# Patient Record
Sex: Female | Born: 1985 | Race: Black or African American | Hispanic: No | Marital: Single | Smoking: Never smoker
Health system: Southern US, Community
[De-identification: ages and names within clinical notes are randomized; demographics above are authoritative.]

## PROBLEM LIST (undated history)

## (undated) DIAGNOSIS — A630 Anogenital (venereal) warts: Secondary | ICD-10-CM

## (undated) DIAGNOSIS — IMO0002 Reserved for concepts with insufficient information to code with codable children: Secondary | ICD-10-CM

## (undated) DIAGNOSIS — R87619 Unspecified abnormal cytological findings in specimens from cervix uteri: Secondary | ICD-10-CM

## (undated) HISTORY — DX: Reserved for concepts with insufficient information to code with codable children: IMO0002

## (undated) HISTORY — PX: WRIST SURGERY: SHX841

## (undated) HISTORY — DX: Anogenital (venereal) warts: A63.0

## (undated) HISTORY — PX: CHOLECYSTECTOMY: SHX55

## (undated) HISTORY — DX: Unspecified abnormal cytological findings in specimens from cervix uteri: R87.619

---
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
## 2005-09-20 ENCOUNTER — Emergency Department (HOSPITAL_COMMUNITY): Admission: EM | Admit: 2005-09-20 | Discharge: 2005-09-20 | Payer: Self-pay | Admitting: Emergency Medicine

## 2006-08-15 ENCOUNTER — Emergency Department (HOSPITAL_COMMUNITY): Admission: EM | Admit: 2006-08-15 | Discharge: 2006-08-15 | Payer: Self-pay | Admitting: Emergency Medicine

## 2006-12-11 ENCOUNTER — Emergency Department (HOSPITAL_COMMUNITY): Admission: EM | Admit: 2006-12-11 | Discharge: 2006-12-11 | Payer: Self-pay | Admitting: Emergency Medicine

## 2007-11-17 ENCOUNTER — Emergency Department (HOSPITAL_COMMUNITY): Admission: EM | Admit: 2007-11-17 | Discharge: 2007-11-17 | Payer: Self-pay | Admitting: Emergency Medicine

## 2008-10-11 ENCOUNTER — Emergency Department (HOSPITAL_COMMUNITY): Admission: EM | Admit: 2008-10-11 | Discharge: 2008-10-11 | Payer: Self-pay | Admitting: Emergency Medicine

## 2008-10-17 ENCOUNTER — Emergency Department (HOSPITAL_COMMUNITY): Admission: EM | Admit: 2008-10-17 | Discharge: 2008-10-17 | Payer: Self-pay | Admitting: Emergency Medicine

## 2008-10-28 ENCOUNTER — Emergency Department (HOSPITAL_COMMUNITY): Admission: EM | Admit: 2008-10-28 | Discharge: 2008-10-28 | Payer: Self-pay | Admitting: Emergency Medicine

## 2010-06-03 ENCOUNTER — Ambulatory Visit: Payer: Self-pay | Admitting: Diagnostic Radiology

## 2010-06-03 ENCOUNTER — Emergency Department (HOSPITAL_BASED_OUTPATIENT_CLINIC_OR_DEPARTMENT_OTHER): Admission: EM | Admit: 2010-06-03 | Discharge: 2010-06-03 | Payer: Self-pay | Admitting: Emergency Medicine

## 2010-09-02 ENCOUNTER — Emergency Department (HOSPITAL_COMMUNITY): Admission: EM | Admit: 2010-09-02 | Discharge: 2010-09-02 | Payer: Self-pay | Admitting: Family Medicine

## 2011-09-15 LAB — URINALYSIS, ROUTINE W REFLEX MICROSCOPIC
Bilirubin Urine: NEGATIVE
Glucose, UA: NEGATIVE
Ketones, ur: NEGATIVE
Nitrite: NEGATIVE
Protein, ur: NEGATIVE
Specific Gravity, Urine: 1.017
Urobilinogen, UA: 1
pH: 6

## 2011-09-15 LAB — URINE MICROSCOPIC-ADD ON

## 2011-09-15 LAB — POCT PREGNANCY, URINE: Preg Test, Ur: NEGATIVE

## 2012-05-30 ENCOUNTER — Encounter (HOSPITAL_BASED_OUTPATIENT_CLINIC_OR_DEPARTMENT_OTHER): Payer: Self-pay | Admitting: *Deleted

## 2012-05-30 ENCOUNTER — Emergency Department (HOSPITAL_BASED_OUTPATIENT_CLINIC_OR_DEPARTMENT_OTHER)
Admission: EM | Admit: 2012-05-30 | Discharge: 2012-05-30 | Disposition: A | Discharge status: Home / Self Care | Payer: BC Managed Care – PPO | Attending: Emergency Medicine | Admitting: Emergency Medicine

## 2012-05-30 DIAGNOSIS — Z331 Pregnant state, incidental: Secondary | ICD-10-CM | POA: Insufficient documentation

## 2012-05-30 DIAGNOSIS — R51 Headache: Secondary | ICD-10-CM | POA: Insufficient documentation

## 2012-05-30 DIAGNOSIS — Z3490 Encounter for supervision of normal pregnancy, unspecified, unspecified trimester: Secondary | ICD-10-CM

## 2012-05-30 DIAGNOSIS — Z9089 Acquired absence of other organs: Secondary | ICD-10-CM | POA: Insufficient documentation

## 2012-05-30 LAB — PREGNANCY, URINE: Preg Test, Ur: POSITIVE — AB

## 2012-05-30 MED ORDER — METOCLOPRAMIDE HCL 5 MG/ML IJ SOLN
10.0000 mg | Freq: Once | INTRAMUSCULAR | Status: AC
  Administered 2012-05-30: 10 mg via INTRAVENOUS
  Filled 2012-05-30 (×2): qty 2

## 2012-05-30 MED ORDER — DIPHENHYDRAMINE HCL 50 MG/ML IJ SOLN
25.0000 mg | Freq: Once | INTRAMUSCULAR | Status: AC
  Administered 2012-05-30: 25 mg via INTRAVENOUS
  Filled 2012-05-30 (×2): qty 1

## 2012-05-30 NOTE — ED Notes (Signed)
Pt relaxing at present time on bed with family present, pt laughing and no acute distress noted. Pt currently playing on cell phone. And reports taking pregnancy test on Saturday and (+). No distress to loud noises, light, or other auras noted. Reports hx of migraines and reports this is not like any prior headache. Gait steady, ambulates well, no ataxia. Pt taking po items well prior to arrival. No active N V D CP LOC SOB. PERRLA.

## 2012-05-30 NOTE — Discharge Instructions (Signed)
ABCs of Pregnancy A Antepartum care is very important. Be sure you see your doctor and get prenatal care as soon as you think you are pregnant. At this time, you will be tested for infection, genetic abnormalities and potential problems with you and the pregnancy. This is the time to discuss diet, exercise, work, medications, labor, pain medication during labor and the possibility of a cesarean delivery. Ask any questions that may concern you. It is important to see your doctor regularly throughout your pregnancy. Avoid exposure to toxic substances and chemicals - such as cleaning solvents, lead and mercury, some insecticides, and paint. Pregnant women should avoid exposure to paint fumes, and fumes that cause you to feel ill, dizzy or faint. When possible, it is a good idea to have a pre-pregnancy consultation with your caregiver to begin some important recommendations your caregiver suggests such as, taking folic acid, exercising, quitting smoking, avoiding alcoholic beverages, etc. B Breastfeeding is the healthiest choice for both you and your baby. It has many nutritional benefits for the baby and health benefits for the mother. It also creates a very tight and loving bond between the baby and mother. Talk to your doctor, your family and friends, and your employer about how you choose to feed your baby and how they can support you in your decision. Not all birth defects can be prevented, but a woman can take actions that may increase her chance of having a healthy baby. Many birth defects happen very early in pregnancy, sometimes before a woman even knows she is pregnant. Birth defects or abnormalities of any child in your or the father's family should be discussed with your caregiver. Get a good support bra as your breast size changes. Wear it especially when you exercise and when nursing.  C Celebrate the news of your pregnancy with the your spouse/father and family. Childbirth classes are helpful to  take for you and the spouse/father because it helps to understand what happens during the pregnancy, labor and delivery. Cesarean delivery should be discussed with your doctor so you are prepared for that possibility. The pros and cons of circumcision if it is a boy, should be discussed with your pediatrician. Cigarette smoking during pregnancy can result in low birth weight babies. It has been associated with infertility, miscarriages, tubal pregnancies, infant death (mortality) and poor health (morbidity) in childhood. Additionally, cigarette smoking may cause long-term learning disabilities. If you smoke, you should try to quit before getting pregnant and not smoke during the pregnancy. Secondary smoke may also harm a mother and her developing baby. It is a good idea to ask people to stop smoking around you during your pregnancy and after the baby is born. Extra calcium is necessary when you are pregnant and is found in your prenatal vitamin, in dairy products, green leafy vegetables and in calcium supplements. D A healthy diet according to your current weight and height, along with vitamins and mineral supplements should be discussed with your caregiver. Domestic abuse or violence should be made known to your doctor right away to get the situation corrected. Drink more water when you exercise to keep hydrated. Discomfort of your back and legs usually develops and progresses from the middle of the second trimester through to delivery of the baby. This is because of the enlarging baby and uterus, which may also affect your balance. Do not take illegal drugs. Illegal drugs can seriously harm the baby and you. Drink extra fluids (water is best) throughout pregnancy to help  your body keep up with the increases in your blood volume. Drink at least 6 to 8 glasses of water, fruit juice, or milk each day. A good way to know you are drinking enough fluid is when your urine looks almost like clear water or is very light  yellow.  E Eat healthy to get the nutrients you and your unborn baby need. Your meals should include the five basic food groups. Exercise (30 minutes of light to moderate exercise a day) is important and encouraged during pregnancy, if there are no medical problems or problems with the pregnancy. Exercise that causes discomfort or dizziness should be stopped and reported to your caregiver. Emotions during pregnancy can change from being ecstatic to depression and should be understood by you, your partner and your family. F Fetal screening with ultrasound, amniocentesis and monitoring during pregnancy and labor is common and sometimes necessary. Take 400 micrograms of folic acid daily both before, when possible, and during the first few months of pregnancy to reduce the risk of birth defects of the brain and spine. All women who could possibly become pregnant should take a vitamin with folic acid, every day. It is also important to eat a healthy diet with fortified foods (enriched grain products, including cereals, rice, breads, and pastas) and foods with natural sources of folate (orange juice, green leafy vegetables, beans, peanuts, broccoli, asparagus, peas, and lentils). The father should be involved with all aspects of the pregnancy including, the prenatal care, childbirth classes, labor, delivery, and postpartum time. Fathers may also have emotional concerns about being a father, financial needs, and raising a family. G Genetic testing should be done appropriately. It is important to know your family and the father's history. If there have been problems with pregnancies or birth defects in your family, report these to your doctor. Also, genetic counselors can talk with you about the information you might need in making decisions about having a family. You can call a major medical center in your area for help in finding a board-certified genetic counselor. Genetic testing and counseling should be done  before pregnancy when possible, especially if there is a history of problems in the mother's or father's family. Certain ethnic backgrounds are more at risk for genetic defects. H Get familiar with the hospital where you will be having your baby. Get to know how long it takes to get there, the labor and delivery area, and the hospital procedures. Be sure your medical insurance is accepted there. Get your home ready for the baby including, clothes, the baby's room (when possible), furniture and car seat. Hand washing is important throughout the day, especially after handling raw meat and poultry, changing the baby's diaper or using the bathroom. This can help prevent the spread of many bacteria and viruses that cause infection. Your hair may become dry and thinner, but will return to normal a few weeks after the baby is born. Heartburn is a common problem that can be treated by taking antacids recommended by your caregiver, eating smaller meals 5 or 6 times a day, not drinking liquids when eating, drinking between meals and raising the head of your bed 2 to 3 inches. I Insurance to cover you, the baby, doctor and hospital should be reviewed so that you will be prepared to pay any costs not covered by your insurance plan. If you do not have medical insurance, there are usually clinics and services available for you in your community. Take 30 milligrams of iron during  your pregnancy as prescribed by your doctor to reduce the risk of low red blood cells (anemia) later in pregnancy. All women of childbearing age should eat a diet rich in iron. J There should be a joint effort for the mother, father and any other children to adapt to the pregnancy financially, emotionally, and psychologically during the pregnancy. Join a support group for moms-to-be. Or, join a class on parenting or childbirth. Have the family participate when possible. K Know your limits. Let your caregiver know if you experience any of the  following:   Pain of any kind.   Strong cramps.   You develop a lot of weight in a short period of time (5 pounds in 3 to 5 days).   Vaginal bleeding, leaking of amniotic fluid.   Headache, vision problems.   Dizziness, fainting, shortness of breath.   Chest pain.   Fever of 102 F (38.9 C) or higher.   Gush of clear fluid from your vagina.   Painful urination.   Domestic violence.   Irregular heartbeat (palpitations).   Rapid beating of the heart (tachycardia).   Constant feeling sick to your stomach (nauseous) and vomiting.   Trouble walking, fluid retention (edema).   Muscle weakness.   If your baby has decreased activity.   Persistent diarrhea.   Abnormal vaginal discharge.   Uterine contractions at 20-minute intervals.   Back pain that travels down your leg.  L Learn and practice that what you eat and drink should be in moderation and healthy for you and your baby. Legal drugs such as alcohol and caffeine are important issues for pregnant women. There is no safe amount of alcohol a woman can drink while pregnant. Fetal alcohol syndrome, a disorder characterized by growth retardation, facial abnormalities, and central nervous system dysfunction, is caused by a woman's use of alcohol during pregnancy. Caffeine, found in tea, coffee, soft drinks and chocolate, should also be limited. Be sure to read labels when trying to cut down on caffeine during pregnancy. More than 200 foods, beverages, and over-the-counter medications contain caffeine and have a high salt content! There are coffees and teas that do not contain caffeine. M Medical conditions such as diabetes, epilepsy, and high blood pressure should be treated and kept under control before pregnancy when possible, but especially during pregnancy. Ask your caregiver about any medications that may need to be changed or adjusted during pregnancy. If you are currently taking any medications, ask your caregiver if it  is safe to take them while you are pregnant or before getting pregnant when possible. Also, be sure to discuss any herbs or vitamins you are taking. They are medicines, too! Discuss with your doctor all medications, prescribed and over-the-counter, that you are taking. During your prenatal visit, discuss the medications your doctor may give you during labor and delivery. N Never be afraid to ask your doctor or caregiver questions about your health, the progress of the pregnancy, family problems, stressful situations, and recommendation for a pediatrician, if you do not have one. It is better to take all precautions and discuss any questions or concerns you may have during your office visits. It is a good idea to write down your questions before you visit the doctor. O Over-the-counter cough and cold remedies may contain alcohol or other ingredients that should be avoided during pregnancy. Ask your caregiver about prescription, herbs or over-the-counter medications that you are taking or may consider taking while pregnant.  P Physical activity during pregnancy can  benefit both you and your baby by lessening discomfort and fatigue, providing a sense of well-being, and increasing the likelihood of early recovery after delivery. Light to moderate exercise during pregnancy strengthens the belly (abdominal) and back muscles. This helps improve posture. Practicing yoga, walking, swimming, and cycling on a stationary bicycle are usually safe exercises for pregnant women. Avoid scuba diving, exercise at high altitudes (over 3000 feet), skiing, horseback riding, contact sports, etc. Always check with your doctor before beginning any kind of exercise, especially during pregnancy and especially if you did not exercise before getting pregnant. Q Queasiness, stomach upset and morning sickness are common during pregnancy. Eating a couple of crackers or dry toast before getting out of bed. Foods that you normally love may  make you feel sick to your stomach. You may need to substitute other nutritious foods. Eating 5 or 6 small meals a day instead of 3 large ones may make you feel better. Do not drink with your meals, drink between meals. Questions that you have should be written down and asked during your prenatal visits. R Read about and make plans to baby-proof your home. There are important tips for making your home a safer environment for your baby. Review the tips and make your home safer for you and your baby. Read food labels regarding calories, salt and fat content in the food. S Saunas, hot tubs, and steam rooms should be avoided while you are pregnant. Excessive high heat may be harmful during your pregnancy. Your caregiver will screen and examine you for sexually transmitted diseases and genetic disorders during your prenatal visits. Learn the signs of labor. Sexual relations while pregnant is safe unless there is a medical or pregnancy problem and your caregiver advises against it. T Traveling long distances should be avoided especially in the third trimester of your pregnancy. If you do have to travel out of state, be sure to take a copy of your medical records and medical insurance plan with you. You should not travel long distances without seeing your doctor first. Most airlines will not allow you to travel after 36 weeks of pregnancy. Toxoplasmosis is an infection caused by a parasite that can seriously harm an unborn baby. Avoid eating undercooked meat and handling cat litter. Be sure to wear gloves when gardening. Tingling of the hands and fingers is not unusual and is due to fluid retention. This will go away after the baby is born. U Womb (uterus) size increases during the first trimester. Your kidneys will begin to function more efficiently. This may cause you to feel the need to urinate more often. You may also leak urine when sneezing, coughing or laughing. This is due to the growing uterus pressing  against your bladder, which lies directly in front of and slightly under the uterus during the first few months of pregnancy. If you experience burning along with frequency of urination or bloody urine, be sure to tell your doctor. The size of your uterus in the third trimester may cause a problem with your balance. It is advisable to maintain good posture and avoid wearing high heels during this time. An ultrasound of your baby may be necessary during your pregnancy and is safe for you and your baby. V Vaccinations are an important concern for pregnant women. Get needed vaccines before pregnancy. Center for Disease Control (http://www.wolf.info/) has clear guidelines for the use of vaccines during pregnancy. Review the list, be sure to discuss it with your doctor. Prenatal vitamins are helpful  and healthy for you and the baby. Do not take extra vitamins except what is recommended. Taking too much of certain vitamins can cause overdose problems. Continuous vomiting should be reported to your caregiver. Varicose veins may appear especially if there is a family history of varicose veins. They should subside after the delivery of the baby. Support hose helps if there is leg discomfort. W Being overweight or underweight during pregnancy may cause problems. Try to get within 15 pounds of your ideal weight before pregnancy. Remember, pregnancy is not a time to be dieting! Do not stop eating or start skipping meals as your weight increases. Both you and your baby need the calories and nutrition you receive from a healthy diet. Be sure to consult with your doctor about your diet. There is a formula and diet plan available depending on whether you are overweight or underweight. Your caregiver or nutritionist can help and advise you if necessary. X Avoid X-rays. If you must have dental work or diagnostic tests, tell your dentist or physician that you are pregnant so that extra care can be taken. X-rays should only be taken when  the risks of not taking them outweigh the risk of taking them. If needed, only the minimum amount of radiation should be used. When X-rays are necessary, protective lead shields should be used to cover areas of the body that are not being X-rayed. Y Your baby loves you. Breastfeeding your baby creates a loving and very close bond between the two of you. Give your baby a healthy environment to live in while you are pregnant. Infants and children require constant care and guidance. Their health and safety should be carefully watched at all times. After the baby is born, rest or take a nap when the baby is sleeping. Z Get your ZZZs. Be sure to get plenty of rest. Resting on your side as often as possible, especially on your left side is advised. It provides the best circulation to your baby and helps reduce swelling. Try taking a nap for 30 to 45 minutes in the afternoon when possible. After the baby is born rest or take a nap when the baby is sleeping. Try elevating your feet for that amount of time when possible. It helps the circulation in your legs and helps reduce swelling.  Most information courtesy of the CDC. Document Released: 11/30/2005 Document Revised: 11/19/2011 Document Reviewed: 08/14/2009 [facility] Patient Information 2012 [place], [place].Headaches, Frequently Asked Questions MIGRAINE HEADACHES Q: What is migraine? What causes it? How can I treat it? A: Generally, migraine headaches begin as a dull ache. Then they develop into a constant, throbbing, and pulsating pain. You may experience pain at the temples. You may experience pain at the front or back of one or both sides of the head. The pain is usually accompanied by a combination of:  Nausea.   Vomiting.   Sensitivity to light and noise.  Some people (about 15%) experience an aura (see below) before an attack. The cause of migraine is believed to be chemical reactions in the brain. Treatment for migraine may include over-the-counter  or prescription medications. It may also include self-help techniques. These include relaxation training and biofeedback.  Q: What is an aura? A: About 15% of people with migraine get an "aura". This is a sign of neurological symptoms that occur before a migraine headache. You may see wavy or jagged lines, dots, or flashing lights. You might experience tunnel vision or blind spots in one or both eyes.  The aura can include visual or auditory hallucinations (something imagined). It may include disruptions in smell (such as strange odors), taste or touch. Other symptoms include:  Numbness.   A "pins and needles" sensation.   Difficulty in recalling or speaking the correct word.  These neurological events may last as long as 60 minutes. These symptoms will fade as the headache begins. Q: What is a trigger? A: Certain physical or environmental factors can lead to or "trigger" a migraine. These include:  Foods.   Hormonal changes.   Weather.   Stress.  It is important to remember that triggers are different for everyone. To help prevent migraine attacks, you need to figure out which triggers affect you. Keep a headache diary. This is a good way to track triggers. The diary will help you talk to your healthcare professional about your condition. Q: Does weather affect migraines? A: Bright sunshine, hot, humid conditions, and drastic changes in barometric pressure may lead to, or "trigger," a migraine attack in some people. But studies have shown that weather does not act as a trigger for everyone with migraines. Q: What is the link between migraine and hormones? A: Hormones start and regulate many of your body's functions. Hormones keep your body in balance within a constantly changing environment. The levels of hormones in your body are unbalanced at times. Examples are during menstruation, pregnancy, or menopause. That can lead to a migraine attack. In fact, about three quarters of all women with  migraine report that their attacks are related to the menstrual cycle.  Q: Is there an increased risk of stroke for migraine sufferers? A: The likelihood of a migraine attack causing a stroke is very remote. That is not to say that migraine sufferers cannot have a stroke associated with their migraines. In persons under age 35, the most common associated factor for stroke is migraine headache. But over the course of a person's normal life span, the occurrence of migraine headache may actually be associated with a reduced risk of dying from cerebrovascular disease due to stroke.  Q: What are acute medications for migraine? A: Acute medications are used to treat the pain of the headache after it has started. Examples over-the-counter medications, NSAIDs, ergots, and triptans.  Q: What are the triptans? A: Triptans are the newest class of abortive medications. They are specifically targeted to treat migraine. Triptans are vasoconstrictors. They moderate some chemical reactions in the brain. The triptans work on receptors in your brain. Triptans help to restore the balance of a neurotransmitter called serotonin. Fluctuations in levels of serotonin are thought to be a main cause of migraine.  Q: Are over-the-counter medications for migraine effective? A: Over-the-counter, or "OTC," medications may be effective in relieving mild to moderate pain and associated symptoms of migraine. But you should see your caregiver before beginning any treatment regimen for migraine.  Q: What are preventive medications for migraine? A: Preventive medications for migraine are sometimes referred to as "prophylactic" treatments. They are used to reduce the frequency, severity, and length of migraine attacks. Examples of preventive medications include antiepileptic medications, antidepressants, beta-blockers, calcium channel blockers, and NSAIDs (nonsteroidal anti-inflammatory drugs). Q: Why are anticonvulsants used to treat  migraine? A: During the past few years, there has been an increased interest in antiepileptic drugs for the prevention of migraine. They are sometimes referred to as "anticonvulsants". Both epilepsy and migraine may be caused by similar reactions in the brain.  Q: Why are antidepressants used to treat migraine? A:  Antidepressants are typically used to treat people with depression. They may reduce migraine frequency by regulating chemical levels, such as serotonin, in the brain.  Q: What alternative therapies are used to treat migraine? A: The term "alternative therapies" is often used to describe treatments considered outside the scope of conventional Western medicine. Examples of alternative therapy include acupuncture, acupressure, and yoga. Another common alternative treatment is herbal therapy. Some herbs are believed to relieve headache pain. Always discuss alternative therapies with your caregiver before proceeding. Some herbal products contain arsenic and other toxins. TENSION HEADACHES Q: What is a tension-type headache? What causes it? How can I treat it? A: Tension-type headaches occur randomly. They are often the result of temporary stress, anxiety, fatigue, or anger. Symptoms include soreness in your temples, a tightening band-like sensation around your head (a "vice-like" ache). Symptoms can also include a pulling feeling, pressure sensations, and contracting head and neck muscles. The headache begins in your forehead, temples, or the back of your head and neck. Treatment for tension-type headache may include over-the-counter or prescription medications. Treatment may also include self-help techniques such as relaxation training and biofeedback. CLUSTER HEADACHES Q: What is a cluster headache? What causes it? How can I treat it? A: Cluster headache gets its name because the attacks come in groups. The pain arrives with little, if any, warning. It is usually on one side of the head. A tearing  or bloodshot eye and a runny nose on the same side of the headache may also accompany the pain. Cluster headaches are believed to be caused by chemical reactions in the brain. They have been described as the most severe and intense of any headache type. Treatment for cluster headache includes prescription medication and oxygen. SINUS HEADACHES Q: What is a sinus headache? What causes it? How can I treat it? A: When a cavity in the bones of the face and skull (a sinus) becomes inflamed, the inflammation will cause localized pain. This condition is usually the result of an allergic reaction, a tumor, or an infection. If your headache is caused by a sinus blockage, such as an infection, you will probably have a fever. An x-ray will confirm a sinus blockage. Your caregiver's treatment might include antibiotics for the infection, as well as antihistamines or decongestants.  REBOUND HEADACHES Q: What is a rebound headache? What causes it? How can I treat it? A: A pattern of taking acute headache medications too often can lead to a condition known as "rebound headache." A pattern of taking too much headache medication includes taking it more than 2 days per week or in excessive amounts. That means more than the label or a caregiver advises. With rebound headaches, your medications not only stop relieving pain, they actually begin to cause headaches. Doctors treat rebound headache by tapering the medication that is being overused. Sometimes your caregiver will gradually substitute a different type of treatment or medication. Stopping may be a challenge. Regularly overusing a medication increases the potential for serious side effects. Consult a caregiver if you regularly use headache medications more than 2 days per week or more than the label advises. ADDITIONAL QUESTIONS AND ANSWERS Q: What is biofeedback? A: Biofeedback is a self-help treatment. Biofeedback uses special equipment to monitor your body's  involuntary physical responses. Biofeedback monitors:  Breathing.   Pulse.   Heart rate.   Temperature.   Muscle tension.   Brain activity.  Biofeedback helps you refine and perfect your relaxation exercises. You learn to control the  physical responses that are related to stress. Once the technique has been mastered, you do not need the equipment any more. Q: Are headaches hereditary? A: Four out of five (80%) of people that suffer report a family history of migraine. Scientists are not sure if this is genetic or a family predisposition. Despite the uncertainty, a child has a 50% chance of having migraine if one parent suffers. The child has a 75% chance if both parents suffer.  Q: Can children get headaches? A: By the time they reach high school, most young people have experienced some type of headache. Many safe and effective approaches or medications can prevent a headache from occurring or stop it after it has begun.  Q: What type of doctor should I see to diagnose and treat my headache? A: Start with your primary caregiver. Discuss his or her experience and approach to headaches. Discuss methods of classification, diagnosis, and treatment. Your caregiver may decide to recommend you to a headache specialist, depending upon your symptoms or other physical conditions. Having diabetes, allergies, etc., may require a more comprehensive and inclusive approach to your headache. The National Head[employer] Foundation will provide, upon request, a list of [facility] physician members in your state. Document Released: 02/20/2004 Document Revised: 11/19/2011 Document Reviewed: 07/30/2008 [facility] Patient Information 2012 [place], [place].

## 2012-05-30 NOTE — ED Provider Notes (Signed)
History     CSN: [id number]  Arrival date & time 05/30/12  5621   First MD Initiated Contact with Patient 05/30/12 1836      Chief Complaint  Patient presents with  . Headache  . Blurred Vision    (Consider location/radiation/quality/duration/timing/severity/associated sxs/prior treatment) Patient is a 26 y.o. female presenting with headaches. The history is provided by the patient. No language interpreter was used.  Headache  This is a new problem. The current episode started yesterday. The problem occurs constantly. The problem has not changed since onset.The pain is located in the right unilateral region. The quality of the pain is described as sharp and throbbing. The pain is moderate. The pain does not radiate. She has tried nothing for the symptoms.  Pt reports she has a history of migranes.  She has not taken any medications.   History reviewed. No pertinent past medical history.  Past Surgical History  Procedure Date  . Cholecystectomy     History reviewed. No pertinent family history.  History  Substance Use Topics  . Smoking status: Never Smoker   . Smokeless tobacco: Not on file  . Alcohol Use: No    OB History    Grav Para Term Preterm Abortions TAB SAB Ect Mult Living                  Review of Systems  Neurological: Positive for headaches.  All other systems reviewed and are negative.    Allergies  Tramadol  Home Medications   Current Outpatient Rx  Name Route Sig Dispense Refill  . NAPROXEN SODIUM 220 MG PO TABS Oral Take 440 mg by mouth 2 (two) times daily with a meal. Patient used this medication for her headache.      BP 120/57  Pulse 81  Temp 98.7 F (37.1 C) (Oral)  Resp 16  Ht 5\' 3"  (1.6 m)  Wt 160 lb (72.576 kg)  BMI 28.34 kg/m2  SpO2 100%  LMP 04/16/2012  Physical Exam  Nursing note and vitals reviewed. Constitutional: She is oriented to person, place, and time. She appears well-developed and well-nourished.  HENT:    Head: Normocephalic and atraumatic.  Right Ear: External ear normal.  Left Ear: External ear normal.  Nose: Nose normal.  Mouth/Throat: Oropharynx is clear and moist.  Eyes: Conjunctivae and EOM are normal. Pupils are equal, round, and reactive to light.  Neck: Normal range of motion. Neck supple.  Cardiovascular: Normal rate and normal heart sounds.   Pulmonary/Chest: Effort normal and breath sounds normal.  Abdominal: Soft.  Musculoskeletal: Normal range of motion.  Neurological: She is alert and oriented to person, place, and time. She has normal reflexes.  Skin: Skin is warm.  Psychiatric: She has a normal mood and affect.    ED Course  Procedures (including critical care time)  Labs Reviewed - No data to display No results found.   1. Headache   2. Pregnancy       MDM   I doubt pathologic headache, sounds like migrane.  Pt request a pregnancy test here.  Pt had a positive 3 days ago.         Lonia Skinner [place], [place] 05/30/12 2049

## 2012-05-30 NOTE — ED Notes (Signed)
Pt states positive preg at home  X 3 days ago, c/o h/a and blurred vision x 1 day

## 2012-05-30 NOTE — ED Provider Notes (Signed)
Medical screening examination/treatment/procedure(s) were performed by non-physician practitioner and as supervising physician I was immediately available for consultation/collaboration.   Akiyah Eppolito, MD 05/30/12 2356 

## 2012-06-27 LAB — OB RESULTS CONSOLE RPR: RPR: NONREACTIVE

## 2012-06-27 LAB — OB RESULTS CONSOLE GC/CHLAMYDIA: Chlamydia: NEGATIVE

## 2012-11-16 ENCOUNTER — Encounter: Payer: Medicaid Other | Attending: Obstetrics and Gynecology | Admitting: *Deleted

## 2012-11-16 VITALS — Ht 63.0 in | Wt 189.7 lb

## 2012-11-16 DIAGNOSIS — O99810 Abnormal glucose complicating pregnancy: Secondary | ICD-10-CM

## 2012-11-16 DIAGNOSIS — Z713 Dietary counseling and surveillance: Secondary | ICD-10-CM | POA: Insufficient documentation

## 2012-11-17 ENCOUNTER — Encounter: Payer: Self-pay | Admitting: *Deleted

## 2012-11-17 NOTE — Progress Notes (Signed)
  Patient was seen on 11/16/12 for Gestational Diabetes self-management class at the [facility]. The following learning objectives were met by the patient during this course:   States the definition of Gestational Diabetes  States why dietary management is important in controlling blood glucose  Describes the effects each nutrient has on blood glucose levels  Demonstrates ability to create a balanced meal plan  Demonstrates carbohydrate counting   States when to check blood glucose levels  Demonstrates proper blood glucose monitoring techniques  States the effect of stress and exercise on blood glucose levels  States the importance of limiting caffeine and abstaining from alcohol and smoking  Blood glucose monitor given: Accu Chek Aviva Plus BG Monitoring Kit Lot # [id number]  Exp: 01/13/2014 Blood glucose reading: 99 mg/dl  Patient instructed to monitor glucose levels: FBS: 60 - <90 1 hour: <140  *Patient received handouts:  Nutrition Diabetes and Pregnancy  Carbohydrate Counting List  Patient will be seen for follow-up as needed.

## 2012-11-17 NOTE — Patient Instructions (Signed)
Goals:  Check glucose levels per MD as instructed  Follow Gestational Diabetes Diet as instructed  Call for follow-up as needed    

## 2012-12-14 NOTE — L&D Delivery Note (Signed)
Delivery Note At 3:05 PM a viable female was delivered via Vaginal, Spontaneous Delivery (Presentation:ROA).  APGAR:8 , 9; weight pending.   Placenta status: Intact, Spontaneous.  Cord: 3 vessels with the following complications: corporal cord x 1    Anesthesia: Epidural  Episiotomy: none Lacerations: right labial abrasion Suture Repair: 3.0 vicryl rapide Est. Blood Loss (mL):   Mom to postpartum.  Baby to nursery-stable.  Oliver Pila 01/24/2013, 3:21 PM

## 2012-12-29 LAB — OB RESULTS CONSOLE GBS: GBS: NEGATIVE

## 2013-01-12 ENCOUNTER — Telehealth (HOSPITAL_COMMUNITY): Payer: Self-pay | Admitting: *Deleted

## 2013-01-12 ENCOUNTER — Encounter (HOSPITAL_COMMUNITY): Payer: Self-pay | Admitting: *Deleted

## 2013-01-12 NOTE — Telephone Encounter (Signed)
Preadmission screen  

## 2013-01-23 ENCOUNTER — Inpatient Hospital Stay (HOSPITAL_COMMUNITY)
Admission: RE | Admit: 2013-01-23 | Discharge: 2013-01-26 | DRG: 775 | Disposition: A | Discharge status: Home / Self Care | Payer: Medicaid Other | Source: Ambulatory Visit | Attending: Obstetrics and Gynecology | Admitting: Obstetrics and Gynecology

## 2013-01-23 DIAGNOSIS — Z22330 Carrier of Group B streptococcus: Secondary | ICD-10-CM

## 2013-01-23 DIAGNOSIS — O99892 Other specified diseases and conditions complicating childbirth: Secondary | ICD-10-CM | POA: Diagnosis present

## 2013-01-23 DIAGNOSIS — O99814 Abnormal glucose complicating childbirth: Principal | ICD-10-CM | POA: Diagnosis present

## 2013-01-23 LAB — CBC
MCH: 28.7 pg (ref 26.0–34.0)
MCHC: 34.1 g/dL (ref 30.0–36.0)
MCV: 84.2 fL (ref 78.0–100.0)
Platelets: 283 10*3/uL (ref 150–400)
RBC: 4.18 MIL/uL (ref 3.87–5.11)
RDW: 13.7 % (ref 11.5–15.5)

## 2013-01-23 MED ORDER — OXYCODONE-ACETAMINOPHEN 5-325 MG PO TABS: 1.0000 | ORAL_TABLET | ORAL | Status: DC | PRN

## 2013-01-23 MED ORDER — PENICILLIN G POTASSIUM 5000000 UNITS IJ SOLR
5.0000 10*6.[IU] | Freq: Once | INTRAVENOUS | Status: AC
  Administered 2013-01-23: 5 10*6.[IU] via INTRAVENOUS
  Filled 2013-01-23: qty 5

## 2013-01-23 MED ORDER — BUTORPHANOL TARTRATE 1 MG/ML IJ SOLN
1.0000 mg | INTRAMUSCULAR | Status: DC | PRN
  Administered 2013-01-24 (×2): 1 mg via INTRAVENOUS
  Filled 2013-01-23 (×2): qty 1

## 2013-01-23 MED ORDER — ACETAMINOPHEN 325 MG PO TABS: 650.0000 mg | ORAL_TABLET | ORAL | Status: DC | PRN

## 2013-01-23 MED ORDER — OXYTOCIN 40 UNITS IN LACTATED RINGERS INFUSION - SIMPLE MED
62.5000 mL/h | INTRAVENOUS | Status: DC
  Administered 2013-01-24: 62.5 mL/h via INTRAVENOUS

## 2013-01-23 MED ORDER — IBUPROFEN 600 MG PO TABS: 600.0000 mg | ORAL_TABLET | Freq: Four times a day (QID) | ORAL | Status: DC | PRN

## 2013-01-23 MED ORDER — OXYTOCIN BOLUS FROM INFUSION: 500.0000 mL | INTRAVENOUS | Status: DC

## 2013-01-23 MED ORDER — CITRIC ACID-SODIUM CITRATE 334-500 MG/5ML PO SOLN: 30.0000 mL | ORAL | Status: DC | PRN

## 2013-01-23 MED ORDER — ONDANSETRON HCL 4 MG/2ML IJ SOLN
4.0000 mg | Freq: Four times a day (QID) | INTRAMUSCULAR | Status: DC | PRN
  Administered 2013-01-24: 4 mg via INTRAVENOUS
  Filled 2013-01-23: qty 2

## 2013-01-23 MED ORDER — LACTATED RINGERS IV SOLN
INTRAVENOUS | Status: DC
  Administered 2013-01-24 (×2): via INTRAVENOUS

## 2013-01-23 MED ORDER — MISOPROSTOL 25 MCG QUARTER TABLET
25.0000 ug | ORAL_TABLET | ORAL | Status: DC | PRN
  Administered 2013-01-23: 25 ug via VAGINAL
  Filled 2013-01-23: qty 0.25

## 2013-01-23 MED ORDER — PENICILLIN G POTASSIUM 5000000 UNITS IJ SOLR
2.5000 10*6.[IU] | INTRAVENOUS | Status: DC
  Administered 2013-01-24 (×4): 2.5 10*6.[IU] via INTRAVENOUS
  Filled 2013-01-23 (×7): qty 2.5

## 2013-01-23 MED ORDER — ZOLPIDEM TARTRATE 5 MG PO TABS
5.0000 mg | ORAL_TABLET | Freq: Every evening | ORAL | Status: AC | PRN
  Administered 2013-01-24: 5 mg via ORAL
  Filled 2013-01-23: qty 1

## 2013-01-23 MED ORDER — LACTATED RINGERS IV SOLN: 500.0000 mL | INTRAVENOUS | Status: DC | PRN

## 2013-01-23 MED ORDER — TERBUTALINE SULFATE 1 MG/ML IJ SOLN: 0.2500 mg | Freq: Once | INTRAMUSCULAR | Status: AC | PRN

## 2013-01-23 MED ORDER — LIDOCAINE HCL (PF) 1 % IJ SOLN
30.0000 mL | INTRAMUSCULAR | Status: DC | PRN
  Filled 2013-01-23: qty 30

## 2013-01-23 NOTE — H&P (Signed)
Katelyn Larson is a 27 y.o. female at 13 6/7 weeks (EDD 01/24/13 by 9 week [place]) presenting for IOL at term.  Prenatal care is complicated by GDM, well-controlled with diet.  She is also GBS+ by a urine cx early in pregnancy, so will be treated.  Maternal Medical History:  Contractions: Frequency: irregular.   Perceived severity is mild.    Fetal activity: Perceived fetal activity is normal.    Prenatal Complications - Diabetes: gestational. Diabetes is managed by diet.      OB History   Grav Para Term Preterm Abortions TAB SAB Ect Mult Living   2 1 1       1     2006 NSVD 6#9oz  Past Medical History  Diagnosis Date  . Diabetes mellitus without complication   . Abnormal Pap smear   . HPV (human papilloma virus) anogenital infection   . Gestational diabetes     diet controlled   Past Surgical History  Procedure Laterality Date  . Cholecystectomy     Family History: family history includes Cancer in her maternal aunt, paternal aunt, and paternal grandmother; Diabetes in her maternal aunt, maternal uncle, paternal aunt, and paternal uncle; and Heart disease in her maternal aunt, maternal uncle, paternal aunt, and paternal uncle. Social History:  reports that she has never smoked. She has never used smokeless tobacco. She reports that she does not drink alcohol or use illicit drugs.   Prenatal Transfer Tool  Maternal Diabetes: Yes:  Diabetes Type:  Diet controlled Genetic Screening: Normal Maternal Ultrasounds/Referrals: Normal Fetal Ultrasounds or other Referrals:  None Maternal Substance Abuse:  No Significant Maternal Medications:  None Significant Maternal Lab Results:  Lab values include: Group B Strep positive Other Comments:  None  ROS    Last menstrual period 06/27/2012. Maternal Exam:  Uterine Assessment: Contraction strength is mild.  Contraction frequency is irregular.   Abdomen: Patient reports no abdominal tenderness. Fetal presentation:  vertex  Introitus: Normal vulva. Normal vagina.    Physical Exam  Constitutional: She appears well-developed and well-nourished.  Cardiovascular: Normal rate and regular rhythm.   Respiratory: Effort normal and breath sounds normal.  GI: Soft. Bowel sounds are normal.  Genitourinary: Vagina normal and uterus normal.  Neurological: She is alert.  Psychiatric: She has a normal mood and affect. Her behavior is normal.    Prenatal labs: ABO, Rh: AB/Positive/-- (07/15 0000) Antibody: Negative (07/15 0000) Rubella: Immune (07/15 0000) RPR: Nonreactive (07/15 0000)  HBsAg: Negative (07/15 0000)  HIV: Non-reactive (07/15 0000)  GBS: positive on an early UA  One hour GTT 140 Three hour 99/178/184/151 Hgb AA First trimester screen and AFP WNL  Assessment/Plan: Pt for IOL at term with a borderline favorable cervix.  Will admit and ripen with cytotec this pm.  Will treat with PCN for a +GBS in her urine early in pregnancy.   Oliver Pila 01/23/2013, 6:02 PM

## 2013-01-24 ENCOUNTER — Inpatient Hospital Stay (HOSPITAL_COMMUNITY): Admission: RE | Admit: 2013-01-24 | Payer: BC Managed Care – PPO | Source: Ambulatory Visit

## 2013-01-24 ENCOUNTER — Encounter (HOSPITAL_COMMUNITY): Payer: Self-pay | Admitting: Anesthesiology

## 2013-01-24 ENCOUNTER — Inpatient Hospital Stay (HOSPITAL_COMMUNITY): Payer: Medicaid Other | Admitting: Anesthesiology

## 2013-01-24 ENCOUNTER — Encounter (HOSPITAL_COMMUNITY): Payer: Self-pay

## 2013-01-24 LAB — RPR: RPR Ser Ql: NONREACTIVE

## 2013-01-24 LAB — TYPE AND SCREEN

## 2013-01-24 LAB — GLUCOSE, CAPILLARY: Glucose-Capillary: 118 mg/dL — ABNORMAL HIGH (ref 70–99)

## 2013-01-24 MED ORDER — TETANUS-DIPHTH-ACELL PERTUSSIS 5-2.5-18.5 LF-MCG/0.5 IM SUSP: 0.5000 mL | Freq: Once | INTRAMUSCULAR | Status: DC

## 2013-01-24 MED ORDER — IBUPROFEN 600 MG PO TABS
600.0000 mg | ORAL_TABLET | Freq: Four times a day (QID) | ORAL | Status: DC
  Administered 2013-01-24 – 2013-01-26 (×8): 600 mg via ORAL
  Filled 2013-01-24 (×8): qty 1

## 2013-01-24 MED ORDER — PHENYLEPHRINE 40 MCG/ML (10ML) SYRINGE FOR IV PUSH (FOR BLOOD PRESSURE SUPPORT)
80.0000 ug | PREFILLED_SYRINGE | INTRAVENOUS | Status: DC | PRN
  Filled 2013-01-24: qty 5

## 2013-01-24 MED ORDER — LANOLIN HYDROUS EX OINT: TOPICAL_OINTMENT | CUTANEOUS | Status: DC | PRN

## 2013-01-24 MED ORDER — WITCH HAZEL-GLYCERIN EX PADS: 1.0000 "application " | MEDICATED_PAD | CUTANEOUS | Status: DC | PRN

## 2013-01-24 MED ORDER — ZOLPIDEM TARTRATE 5 MG PO TABS: 5.0000 mg | ORAL_TABLET | Freq: Every evening | ORAL | Status: DC | PRN

## 2013-01-24 MED ORDER — DIBUCAINE 1 % RE OINT: 1.0000 "application " | TOPICAL_OINTMENT | RECTAL | Status: DC | PRN

## 2013-01-24 MED ORDER — OXYCODONE-ACETAMINOPHEN 5-325 MG PO TABS
1.0000 | ORAL_TABLET | ORAL | Status: DC | PRN
  Administered 2013-01-25: 1 via ORAL
  Administered 2013-01-25 (×2): 2 via ORAL
  Filled 2013-01-24: qty 2
  Filled 2013-01-24: qty 1
  Filled 2013-01-24: qty 2

## 2013-01-24 MED ORDER — EPHEDRINE 5 MG/ML INJ
10.0000 mg | INTRAVENOUS | Status: DC | PRN
  Filled 2013-01-24 (×2): qty 4

## 2013-01-24 MED ORDER — BENZOCAINE-MENTHOL 20-0.5 % EX AERO
1.0000 "application " | INHALATION_SPRAY | CUTANEOUS | Status: DC | PRN
  Administered 2013-01-25: 1 via TOPICAL
  Filled 2013-01-24: qty 56

## 2013-01-24 MED ORDER — FENTANYL 2.5 MCG/ML BUPIVACAINE 1/10 % EPIDURAL INFUSION (WH - ANES)
14.0000 mL/h | INTRAMUSCULAR | Status: DC
  Administered 2013-01-24: 14 mL/h via EPIDURAL
  Filled 2013-01-24: qty 125

## 2013-01-24 MED ORDER — ONDANSETRON HCL 4 MG PO TABS: 4.0000 mg | ORAL_TABLET | ORAL | Status: DC | PRN

## 2013-01-24 MED ORDER — ONDANSETRON HCL 4 MG/2ML IJ SOLN: 4.0000 mg | INTRAMUSCULAR | Status: DC | PRN

## 2013-01-24 MED ORDER — OXYTOCIN 40 UNITS IN LACTATED RINGERS INFUSION - SIMPLE MED
1.0000 m[IU]/min | INTRAVENOUS | Status: DC
  Administered 2013-01-24: 2 m[IU]/min via INTRAVENOUS
  Filled 2013-01-24: qty 1000

## 2013-01-24 MED ORDER — LACTATED RINGERS IV SOLN: 500.0000 mL | Freq: Once | INTRAVENOUS | Status: DC

## 2013-01-24 MED ORDER — DIPHENHYDRAMINE HCL 25 MG PO CAPS: 25.0000 mg | ORAL_CAPSULE | Freq: Four times a day (QID) | ORAL | Status: DC | PRN

## 2013-01-24 MED ORDER — LIDOCAINE HCL (PF) 1 % IJ SOLN
INTRAMUSCULAR | Status: DC | PRN
  Administered 2013-01-24 (×4): 4 mL

## 2013-01-24 MED ORDER — PRENATAL MULTIVITAMIN CH
1.0000 | ORAL_TABLET | Freq: Every day | ORAL | Status: DC
  Administered 2013-01-25 – 2013-01-26 (×2): 1 via ORAL
  Filled 2013-01-24 (×2): qty 1

## 2013-01-24 MED ORDER — EPHEDRINE 5 MG/ML INJ: 10.0000 mg | INTRAVENOUS | Status: DC | PRN

## 2013-01-24 MED ORDER — SENNOSIDES-DOCUSATE SODIUM 8.6-50 MG PO TABS
2.0000 | ORAL_TABLET | Freq: Every day | ORAL | Status: DC
  Administered 2013-01-24 – 2013-01-25 (×2): 2 via ORAL

## 2013-01-24 MED ORDER — TERBUTALINE SULFATE 1 MG/ML IJ SOLN: 0.2500 mg | Freq: Once | INTRAMUSCULAR | Status: DC | PRN

## 2013-01-24 MED ORDER — DIPHENHYDRAMINE HCL 50 MG/ML IJ SOLN: 12.5000 mg | INTRAMUSCULAR | Status: DC | PRN

## 2013-01-24 MED ORDER — SIMETHICONE 80 MG PO CHEW
80.0000 mg | CHEWABLE_TABLET | ORAL | Status: DC | PRN
  Administered 2013-01-25: 80 mg via ORAL

## 2013-01-24 MED ORDER — PHENYLEPHRINE 40 MCG/ML (10ML) SYRINGE FOR IV PUSH (FOR BLOOD PRESSURE SUPPORT): 80.0000 ug | PREFILLED_SYRINGE | INTRAVENOUS | Status: DC | PRN

## 2013-01-24 NOTE — Progress Notes (Signed)
Patient ID: Katelyn Larson, female   DOB: [birth year], 27 y.o.   MRN: [id number] Pt had stadol x 2 last pm with cytotec, ctx q 5 mins  FHR baseline 115-120 +accels Cervix 50/3-4/-2 AROM clear  Will check BS this AM Has received PCN already for +GBS. Pt to get epidural

## 2013-01-24 NOTE — Anesthesia Procedure Notes (Signed)
Epidural Patient location during procedure: OB Start time: 01/24/2013 9:47 AM  Staffing Performed by: anesthesiologist   Preanesthetic Checklist Completed: patient identified, site marked, surgical consent, pre-op evaluation, timeout performed, IV checked, risks and benefits discussed and monitors and equipment checked  Epidural Patient position: sitting Prep: site prepped and draped and DuraPrep Patient monitoring: continuous pulse ox and blood pressure Approach: midline Injection technique: LOR air  Needle:  Needle type: Tuohy  Needle gauge: 17 G Needle length: 9 cm and 9 Needle insertion depth: 6 cm Catheter type: closed end flexible Catheter size: 19 Gauge Catheter at skin depth: 11 cm Test dose: negative  Assessment Events: blood not aspirated, injection not painful, no injection resistance, negative IV test and no paresthesia  Additional Notes Discussed risk of headache, infection, bleeding, nerve injury and failed or incomplete block.  Patient voices understanding and wishes to proceed.  Epidural placed easily on first attempt.  No paresthesia.  Patient tolerated procedure well with no apparent complications.  Jasmine December, MDReason for block:procedure for pain

## 2013-01-24 NOTE — Progress Notes (Signed)
   Subjective: Pt comfortable with epidural  Objective: BP 106/92  Pulse 80  Temp(Src) 98.6 F (37 C) (Oral)  Resp 20  Ht 5\' 3"  (1.6 m)  Wt 91.627 kg (202 lb)  BMI 35.79 kg/m2  SpO2 100%  LMP 06/27/2012      FHT:  FHR: 140 bpm, variability: moderate,  accelerations:  Present,  decelerations:  Absent UC:   regular, every 1-3 minutes SVE:   Dilation: 6.5 Effacement (%): 50 Station: -2 Exam by:: Dr. Senaida Ores  Labs: Lab Results  Component Value Date   WBC 9.1 01/23/2013   HGB 12.0 01/23/2013   HCT 35.2* 01/23/2013   MCV 84.2 01/23/2013   PLT 283 01/23/2013    Assessment / Plan: IUPC placed to adjust Pitocin, will follow progress. Oliver Pila 01/24/2013, 1:12 PM

## 2013-01-24 NOTE — Anesthesia Preprocedure Evaluation (Signed)
Anesthesia Evaluation  Patient identified by MRN, date of birth, ID band Patient awake    Reviewed: Allergy & Precautions, H&P , NPO status , Patient's Chart, lab work & pertinent test results, reviewed documented beta blocker date and time   History of Anesthesia Complications Negative for: history of anesthetic complications  Airway Mallampati: III TM Distance: >3 FB Neck ROM: full    Dental  (+) Teeth Intact   Pulmonary neg pulmonary ROS,  breath sounds clear to auscultation        Cardiovascular negative cardio ROS  Rhythm:regular Rate:Normal     Neuro/Psych negative neurological ROS  negative psych ROS   GI/Hepatic negative GI ROS, Neg liver ROS,   Endo/Other  diabetes, GestationalMorbid obesity  Renal/GU negative Renal ROS  negative genitourinary   Musculoskeletal   Abdominal   Peds  Hematology negative hematology ROS (+)   Anesthesia Other Findings   Reproductive/Obstetrics (+) Pregnancy                           Anesthesia Physical Anesthesia Plan  ASA: III  Anesthesia Plan: Epidural   Post-op Pain Management:    Induction:   Airway Management Planned:   Additional Equipment:   Intra-op Plan:   Post-operative Plan:   Informed Consent: I have reviewed the patients History and Physical, chart, labs and discussed the procedure including the risks, benefits and alternatives for the proposed anesthesia with the patient or authorized representative who has indicated his/her understanding and acceptance.     Plan Discussed with:   Anesthesia Plan Comments:         Anesthesia Quick Evaluation

## 2013-01-25 LAB — CBC
MCH: 28.1 pg (ref 26.0–34.0)
MCHC: 33.2 g/dL (ref 30.0–36.0)
RDW: 13.9 % (ref 11.5–15.5)

## 2013-01-25 LAB — CCBB MATERNAL DONOR DRAW

## 2013-01-25 MED ORDER — OXYCODONE-ACETAMINOPHEN 5-325 MG PO TABS: 1.0000 | ORAL_TABLET | ORAL | Status: DC | PRN

## 2013-01-25 MED ORDER — IBUPROFEN 600 MG PO TABS: 600.0000 mg | ORAL_TABLET | Freq: Four times a day (QID) | ORAL | Status: DC

## 2013-01-25 NOTE — Discharge Summary (Addendum)
Obstetric Discharge Summary Reason for Admission: induction of labor Prenatal Procedures: none Intrapartum Procedures: spontaneous vaginal delivery Postpartum Procedures: none Complications-Operative and Postpartum: right labial abrasion degree perineal laceration Hemoglobin  Date Value Range Status  01/25/2013 11.2* 12.0 - 15.0 g/dL Final     HCT  Date Value Range Status  01/25/2013 33.7* 36.0 - 46.0 % Final    Physical Exam:  General: alert and cooperative Lochia: appropriate Uterine Fundus: firm   Discharge Diagnoses: Term Pregnancy-delivered  Discharge Information: Date: 01/25/2013 Activity: pelvic rest Diet: routine Medications: Ibuprofen and Percocet Condition: improved Instructions: refer to practice specific booklet Discharge to: home Follow-up Information   Follow up with Oliver Pila, MD. Schedule an appointment as soon as possible for a visit in 6 weeks.   Contact information:   [address], [address] [place] [address] [phone]       Newborn Data: Live born female  Birth Weight: 6 lb 1.2 oz (2755 g) APGAR: , 9  Home with mother.  Oliver Pila 01/25/2013, 9:08 AM  Due to inclement weather, pt decided to stay another day.  D/c to home with no problems PPD #2.

## 2013-01-25 NOTE — Progress Notes (Signed)
Ur chart review completed.  

## 2013-01-25 NOTE — Anesthesia Postprocedure Evaluation (Signed)
  Anesthesia Post-op Note  Patient: Katelyn Larson  Procedure(s) Performed: * No procedures listed *  Patient Location: Mother/Baby  Anesthesia Type:Epidural  Level of Consciousness: awake  Airway and Oxygen Therapy: Patient Spontanous Breathing  Post-op Pain: none  Post-op Assessment: Patient's Cardiovascular Status Stable, Respiratory Function Stable, Patent Airway, No signs of Nausea or vomiting, Adequate PO intake, Pain level controlled, No headache, No backache, No residual numbness and No residual motor weakness  Post-op Vital Signs: Reviewed and stable  Complications: No apparent anesthesia complications

## 2013-01-25 NOTE — Progress Notes (Signed)
Post Partum Day 1 Subjective: no complaints, up ad lib and tolerating PO, requests early d/c  Objective: Blood pressure 103/66, pulse 78, temperature 98.3 F (36.8 C), temperature source Oral, resp. rate 16, height 5\' 3"  (1.6 m), weight 91.627 kg (202 lb), last menstrual period 06/27/2012, SpO2 96.00%, unknown if currently breastfeeding.  Physical Exam:  General: alert and cooperative Lochia: appropriate Uterine Fundus: firm   Recent Labs  01/23/13 2110 01/25/13 0450  HGB 12.0 11.2*  HCT 35.2* 33.7*    Assessment/Plan: Discharge home if baby able to go Motrin, percocet Plans Mirena   LOS: 2 days   Makenzee Choudhry W 01/25/2013, 9:07 AM

## 2013-01-26 NOTE — Progress Notes (Signed)
PPD #2 Stayed yesterday due to snow, no problems Afeb, VSS D/c home

## 2014-09-20 ENCOUNTER — Emergency Department (HOSPITAL_BASED_OUTPATIENT_CLINIC_OR_DEPARTMENT_OTHER)
Admission: EM | Admit: 2014-09-20 | Discharge: 2014-09-20 | Disposition: A | Discharge status: Home / Self Care | Payer: BC Managed Care – PPO | Attending: Emergency Medicine | Admitting: Emergency Medicine

## 2014-09-20 ENCOUNTER — Encounter (HOSPITAL_BASED_OUTPATIENT_CLINIC_OR_DEPARTMENT_OTHER): Payer: Self-pay | Admitting: Emergency Medicine

## 2014-09-20 DIAGNOSIS — Z8619 Personal history of other infectious and parasitic diseases: Secondary | ICD-10-CM | POA: Insufficient documentation

## 2014-09-20 DIAGNOSIS — E119 Type 2 diabetes mellitus without complications: Secondary | ICD-10-CM | POA: Insufficient documentation

## 2014-09-20 DIAGNOSIS — N390 Urinary tract infection, site not specified: Secondary | ICD-10-CM | POA: Insufficient documentation

## 2014-09-20 DIAGNOSIS — Z3202 Encounter for pregnancy test, result negative: Secondary | ICD-10-CM | POA: Insufficient documentation

## 2014-09-20 DIAGNOSIS — Z8632 Personal history of gestational diabetes: Secondary | ICD-10-CM | POA: Insufficient documentation

## 2014-09-20 LAB — URINALYSIS, ROUTINE W REFLEX MICROSCOPIC
BILIRUBIN URINE: NEGATIVE
GLUCOSE, UA: NEGATIVE mg/dL
Ketones, ur: NEGATIVE mg/dL
NITRITE: POSITIVE — AB
PH: 6 (ref 5.0–8.0)
Protein, ur: 30 mg/dL — AB
SPECIFIC GRAVITY, URINE: 1.018 (ref 1.005–1.030)
Urobilinogen, UA: 1 mg/dL (ref 0.0–1.0)

## 2014-09-20 LAB — PREGNANCY, URINE: Preg Test, Ur: NEGATIVE

## 2014-09-20 LAB — URINE MICROSCOPIC-ADD ON

## 2014-09-20 MED ORDER — CEPHALEXIN 500 MG PO CAPS: 500.0000 mg | ORAL_CAPSULE | Freq: Four times a day (QID) | ORAL | Status: DC

## 2014-09-20 MED ORDER — CEPHALEXIN 250 MG PO CAPS
500.0000 mg | ORAL_CAPSULE | Freq: Once | ORAL | Status: AC
  Administered 2014-09-20: 500 mg via ORAL
  Filled 2014-09-20: qty 2

## 2014-09-20 NOTE — Discharge Instructions (Signed)

## 2014-09-20 NOTE — ED Notes (Addendum)
Pain in her right abdomen for a week. Odor to her urine. She took a left over PCN 500mg  tablet this am.

## 2014-09-20 NOTE — ED Provider Notes (Signed)
CSN: [id number]     Arrival date & time 09/20/14  1843 History  This chart was scribed for Gilda Creasehristopher J. Pollina, * by Jarvis Morganaylor Ferguson, ED Scribe. This patient was seen in room MH12/MH12 and the patient's care was started at 9:10 PM.    Chief Complaint  Patient presents with  . Urinary Tract Infection    The history is provided by the patient. No language interpreter was used.   HPI Comments: Katelyn Larson is a 28 y.o. female who presents to the Emergency Department complaining of a possible UTI. She states she is having associated kidney pain, foul smelling urine, increased urine output. She states that the kidney pain is alleviated by drinking water. She states she recently started a new job and has been sitting down a lot and drinking a lot more sodas than normal. She has not taken anything for her symptoms. She is allergic to Tramadol. She denies any flank pain, hematuria, vomiting, dysuria, back pain, fever, abdominal pain, or chills.     Past Medical History  Diagnosis Date  . Diabetes mellitus without complication   . Abnormal Pap smear   . HPV (human papilloma virus) anogenital infection   . Gestational diabetes     diet controlled   Past Surgical History  Procedure Laterality Date  . Cholecystectomy     Family History  Problem Relation Age of Onset  . Cancer Maternal Aunt     breast  . Diabetes Maternal Aunt   . Heart disease Maternal Aunt   . Diabetes Maternal Uncle   . Heart disease Maternal Uncle   . Cancer Paternal Aunt     breast  . Diabetes Paternal Aunt   . Heart disease Paternal Aunt   . Diabetes Paternal Uncle   . Heart disease Paternal Uncle   . Cancer Paternal Grandmother     breast   History  Substance Use Topics  . Smoking status: Never Smoker   . Smokeless tobacco: Never Used  . Alcohol Use: No   OB History   Grav Para Term Preterm Abortions TAB SAB Ect Mult Living   2 2 2       2      Review of Systems  Constitutional: Negative  for fever and chills.  Gastrointestinal: Negative for nausea, vomiting and abdominal pain.  Genitourinary: Positive for frequency. Negative for dysuria, hematuria and flank pain.       + "kidney pain" + foul smelling urine  Musculoskeletal: Negative for back pain.  All other systems reviewed and are negative.     Allergies  Latex and Tramadol  Home Medications   Prior to Admission medications   Medication Sig Start Date End Date Taking? Authorizing Provider  ibuprofen (ADVIL,MOTRIN) 600 MG tablet Take 1 tablet (600 mg total) by mouth every 6 (six) hours. 01/25/13   Oliver PilaKathy W Richardson, MD  oxyCODONE-acetaminophen (PERCOCET/ROXICET) 5-325 MG per tablet Take 1-2 tablets by mouth every 4 (four) hours as needed. 01/25/13   Oliver PilaKathy W Richardson, MD   Triage Vitals: BP 122/68  Pulse 74  Temp(Src) 98.1 F (36.7 C) (Oral)  Resp 16  Ht 5\' 3"  (1.6 m)  Wt 158 lb (71.668 kg)  BMI 28.00 kg/m2  SpO2 100%  Physical Exam  Constitutional: She is oriented to person, place, and time. She appears well-developed and well-nourished. No distress.  HENT:  Head: Normocephalic and atraumatic.  Right Ear: Hearing normal.  Left Ear: Hearing normal.  Nose: Nose normal.  Mouth/Throat: Oropharynx is clear  and moist and mucous membranes are normal.  Eyes: Conjunctivae and EOM are normal. Pupils are equal, round, and reactive to light.  Neck: Normal range of motion. Neck supple.  Cardiovascular: Regular rhythm, S1 normal and S2 normal.  Exam reveals no gallop and no friction rub.   No murmur heard. Pulmonary/Chest: Effort normal and breath sounds normal. No respiratory distress. She exhibits no tenderness.  Abdominal: Soft. Normal appearance and bowel sounds are normal. There is no hepatosplenomegaly. There is no tenderness. There is no rebound, no guarding, no tenderness at McBurney's point and negative Murphy's sign. No hernia.  Musculoskeletal: Normal range of motion.  Neurological: She is alert and  oriented to person, place, and time. She has normal strength. No cranial nerve deficit or sensory deficit. Coordination normal. GCS eye subscore is 4. GCS verbal subscore is 5. GCS motor subscore is 6.  Skin: Skin is warm, dry and intact. No rash noted. No cyanosis.  Psychiatric: She has a normal mood and affect. Her speech is normal and behavior is normal. Thought content normal.    ED Course  Procedures (including critical care time)  DIAGNOSTIC STUDIES: Oxygen Saturation is 100% on RA, normal by my interpretation.    COORDINATION OF CARE: 6:57 PM- Will order UA and U-preg.  Pt advised of plan for treatment and pt agrees.     Labs Review Labs Reviewed  URINALYSIS, ROUTINE W REFLEX MICROSCOPIC - Abnormal; Notable for the following:    APPearance CLOUDY (*)    Hgb urine dipstick LARGE (*)    Protein, ur 30 (*)    Nitrite POSITIVE (*)    Leukocytes, UA LARGE (*)    All other components within normal limits  URINE MICROSCOPIC-ADD ON - Abnormal; Notable for the following:    Squamous Epithelial / LPF FEW (*)    Bacteria, UA MANY (*)    All other components within normal limits  PREGNANCY, URINE    Imaging Review No results found.   EKG Interpretation None      MDM   Final diagnoses:  UTI (lower urinary tract infection)   Patient presents to the ER with urinary symptoms. She does not have any fever. Vital signs are stable. She has not been having nausea or vomiting. Urinalysis shows obvious signs of infection. Patient tolerating by mouth, Cipro by mouth for oral treatment for urinary tract infection.  I personally performed the services described in this documentation, which was scribed in my presence. The recorded information has been reviewed and is accurate.      Gilda Crease, MD 09/20/14 2122

## 2014-10-15 ENCOUNTER — Encounter (HOSPITAL_BASED_OUTPATIENT_CLINIC_OR_DEPARTMENT_OTHER): Payer: Self-pay | Admitting: Emergency Medicine

## 2015-04-25 ENCOUNTER — Encounter (HOSPITAL_BASED_OUTPATIENT_CLINIC_OR_DEPARTMENT_OTHER): Payer: Self-pay | Admitting: *Deleted

## 2015-04-25 ENCOUNTER — Emergency Department (HOSPITAL_BASED_OUTPATIENT_CLINIC_OR_DEPARTMENT_OTHER)
Admission: EM | Admit: 2015-04-25 | Discharge: 2015-04-25 | Disposition: A | Discharge status: Home / Self Care | Payer: Medicaid Other | Attending: Emergency Medicine | Admitting: Emergency Medicine

## 2015-04-25 DIAGNOSIS — Z792 Long term (current) use of antibiotics: Secondary | ICD-10-CM | POA: Insufficient documentation

## 2015-04-25 DIAGNOSIS — Z8632 Personal history of gestational diabetes: Secondary | ICD-10-CM | POA: Insufficient documentation

## 2015-04-25 DIAGNOSIS — Z91040 Latex allergy status: Secondary | ICD-10-CM | POA: Insufficient documentation

## 2015-04-25 DIAGNOSIS — Z791 Long term (current) use of non-steroidal anti-inflammatories (NSAID): Secondary | ICD-10-CM | POA: Insufficient documentation

## 2015-04-25 DIAGNOSIS — Z79899 Other long term (current) drug therapy: Secondary | ICD-10-CM | POA: Insufficient documentation

## 2015-04-25 DIAGNOSIS — Z8619 Personal history of other infectious and parasitic diseases: Secondary | ICD-10-CM | POA: Insufficient documentation

## 2015-04-25 DIAGNOSIS — E119 Type 2 diabetes mellitus without complications: Secondary | ICD-10-CM | POA: Insufficient documentation

## 2015-04-25 DIAGNOSIS — K047 Periapical abscess without sinus: Secondary | ICD-10-CM | POA: Insufficient documentation

## 2015-04-25 MED ORDER — PENICILLIN V POTASSIUM 500 MG PO TABS: 500.0000 mg | ORAL_TABLET | Freq: Four times a day (QID) | ORAL | Status: AC

## 2015-04-25 MED ORDER — ACETAMINOPHEN 500 MG PO TABS
1000.0000 mg | ORAL_TABLET | Freq: Once | ORAL | Status: AC
  Administered 2015-04-25: 1000 mg via ORAL
  Filled 2015-04-25: qty 2

## 2015-04-25 NOTE — ED Notes (Signed)
Pt directed to pharmacy to pick up meds 

## 2015-04-25 NOTE — ED Provider Notes (Signed)
CSN: [id number]     Arrival date & time 04/25/15  [id number] History   First MD Initiated Contact with Patient 04/25/15 0913     Chief Complaint  Patient presents with  . Dental Pain     (Consider location/radiation/quality/duration/timing/severity/associated sxs/prior Treatment) Patient is a 29 y.o. female presenting with tooth pain.  Dental Pain Location:  Lower Lower teeth location:  19/LL 1st molar, 18/LL 2nd molar and 30/RL 1st molar Quality:  Dull and aching Severity:  Severe Onset quality:  Gradual Duration:  1 week Timing:  Constant Progression:  Worsening Chronicity:  New Context: dental caries and dental fracture   Relieved by:  Acetaminophen and NSAIDs Exacerbated by: eating. Associated symptoms: facial pain, facial swelling (on left, not on right) and gum swelling   Associated symptoms: no fever, no oral lesions and no trismus     Past Medical History  Diagnosis Date  . Diabetes mellitus without complication   . Abnormal Pap smear   . HPV (human papilloma virus) anogenital infection   . Gestational diabetes     diet controlled   Past Surgical History  Procedure Laterality Date  . Cholecystectomy     Family History  Problem Relation Age of Onset  . Cancer Maternal Aunt     breast  . Diabetes Maternal Aunt   . Heart disease Maternal Aunt   . Diabetes Maternal Uncle   . Heart disease Maternal Uncle   . Cancer Paternal Aunt     breast  . Diabetes Paternal Aunt   . Heart disease Paternal Aunt   . Diabetes Paternal Uncle   . Heart disease Paternal Uncle   . Cancer Paternal Grandmother     breast   History  Substance Use Topics  . Smoking status: Never Smoker   . Smokeless tobacco: Never Used  . Alcohol Use: No   OB History    Gravida Para Term Preterm AB TAB SAB Ectopic Multiple Living   2 2 2       2      Review of Systems  Constitutional: Negative for fever.  HENT: Positive for facial swelling (on left, not on right). Negative for mouth sores.     All other systems reviewed and are negative.     Allergies  Latex and Tramadol  Home Medications   Prior to Admission medications   Medication Sig Start Date End Date Taking? Authorizing Provider  levonorgestrel (MIRENA) 20 MCG/24HR IUD 1 each by Intrauterine route once.   Yes Historical Provider, MD  cephALEXin (KEFLEX) 500 MG capsule Take 1 capsule (500 mg total) by mouth 4 (four) times daily. 09/20/14   Gilda Creasehristopher J Pollina, MD  ibuprofen (ADVIL,MOTRIN) 600 MG tablet Take 1 tablet (600 mg total) by mouth every 6 (six) hours. 01/25/13   Huel CoteKathy Richardson, MD  oxyCODONE-acetaminophen (PERCOCET/ROXICET) 5-325 MG per tablet Take 1-2 tablets by mouth every 4 (four) hours as needed. 01/25/13   Huel CoteKathy Richardson, MD   BP 129/81 mmHg  Pulse 58  Temp(Src) 98.1 F (36.7 C) (Oral)  Resp 16  Ht 5\' 3"  (1.6 m)  Wt 169 lb (76.658 kg)  BMI 29.94 kg/m2  SpO2 100% Physical Exam  Constitutional: She is oriented to person, place, and time. She appears well-developed and well-nourished. No distress.  HENT:  Head: Normocephalic and atraumatic.  Mouth/Throat:    Multiple previous fillings. No gum swelling. No trismus. No palatal fullness.  Eyes: Conjunctivae are normal. No scleral icterus.  Neck: Neck supple.  Cardiovascular: Normal rate and  intact distal pulses.   Pulmonary/Chest: Effort normal. No stridor. No respiratory distress.  Abdominal: Normal appearance. She exhibits no distension.  Neurological: She is alert and oriented to person, place, and time.  Skin: Skin is warm and dry. No rash noted.  Psychiatric: She has a normal mood and affect. Her behavior is normal.  Nursing note and vitals reviewed.   ED Course  Procedures (including critical care time) Labs Review Labs Reviewed - No data to display  Imaging Review No results found.   EKG Interpretation None      MDM   Final diagnoses:  Dental abscess    Dental pain, bilateral, associated with broken teeth and  fillings.  Will treat for dental abscess with PCN.  She is working on dentistry follow up.     Blake DivineJohn Caasi Giglia, MD 04/25/15 [phone]

## 2015-04-25 NOTE — Discharge Instructions (Signed)

## 2015-04-25 NOTE — ED Notes (Signed)
Patient states she has bilateral teeth pain for the last week.  Worse with eating.

## 2015-11-21 ENCOUNTER — Emergency Department (HOSPITAL_BASED_OUTPATIENT_CLINIC_OR_DEPARTMENT_OTHER)
Admission: EM | Admit: 2015-11-21 | Discharge: 2015-11-21 | Disposition: A | Discharge status: Home / Self Care | Payer: Managed Care, Other (non HMO) | Attending: Emergency Medicine | Admitting: Emergency Medicine

## 2015-11-21 ENCOUNTER — Encounter (HOSPITAL_BASED_OUTPATIENT_CLINIC_OR_DEPARTMENT_OTHER): Payer: Self-pay

## 2015-11-21 DIAGNOSIS — E119 Type 2 diabetes mellitus without complications: Secondary | ICD-10-CM | POA: Insufficient documentation

## 2015-11-21 DIAGNOSIS — Z91040 Latex allergy status: Secondary | ICD-10-CM | POA: Insufficient documentation

## 2015-11-21 DIAGNOSIS — A5901 Trichomonal vulvovaginitis: Secondary | ICD-10-CM | POA: Diagnosis not present

## 2015-11-21 DIAGNOSIS — Z202 Contact with and (suspected) exposure to infections with a predominantly sexual mode of transmission: Secondary | ICD-10-CM | POA: Diagnosis not present

## 2015-11-21 DIAGNOSIS — A599 Trichomoniasis, unspecified: Secondary | ICD-10-CM

## 2015-11-21 DIAGNOSIS — Z792 Long term (current) use of antibiotics: Secondary | ICD-10-CM | POA: Diagnosis not present

## 2015-11-21 DIAGNOSIS — Z8632 Personal history of gestational diabetes: Secondary | ICD-10-CM | POA: Insufficient documentation

## 2015-11-21 DIAGNOSIS — Z3202 Encounter for pregnancy test, result negative: Secondary | ICD-10-CM | POA: Insufficient documentation

## 2015-11-21 LAB — WET PREP, GENITAL
CLUE CELLS WET PREP: NONE SEEN
SPERM: NONE SEEN
Yeast Wet Prep HPF POC: NONE SEEN

## 2015-11-21 LAB — RAPID HIV SCREEN (HIV 1/2 AB+AG)
HIV 1/2 Antibodies: NONREACTIVE
HIV-1 P24 Antigen - HIV24: NONREACTIVE

## 2015-11-21 LAB — URINALYSIS, ROUTINE W REFLEX MICROSCOPIC
Bilirubin Urine: NEGATIVE
GLUCOSE, UA: NEGATIVE mg/dL
Ketones, ur: NEGATIVE mg/dL
Nitrite: NEGATIVE
PH: 7 (ref 5.0–8.0)
PROTEIN: NEGATIVE mg/dL
SPECIFIC GRAVITY, URINE: 1.009 (ref 1.005–1.030)

## 2015-11-21 LAB — URINE MICROSCOPIC-ADD ON

## 2015-11-21 LAB — PREGNANCY, URINE: Preg Test, Ur: NEGATIVE

## 2015-11-21 MED ORDER — AZITHROMYCIN 250 MG PO TABS
1000.0000 mg | ORAL_TABLET | Freq: Once | ORAL | Status: AC
  Administered 2015-11-21: 1000 mg via ORAL
  Filled 2015-11-21: qty 4

## 2015-11-21 MED ORDER — CEFTRIAXONE SODIUM 250 MG IJ SOLR
250.0000 mg | Freq: Once | INTRAMUSCULAR | Status: AC
  Administered 2015-11-21: 250 mg via INTRAMUSCULAR
  Filled 2015-11-21: qty 250

## 2015-11-21 MED ORDER — METRONIDAZOLE 500 MG PO TABS
2000.0000 mg | ORAL_TABLET | Freq: Once | ORAL | Status: AC
  Administered 2015-11-21: 2000 mg via ORAL
  Filled 2015-11-21: qty 4

## 2015-11-21 NOTE — ED Provider Notes (Addendum)
CSN: [id number]     Arrival date & time 11/21/15  1437 History   First MD Initiated Contact with Patient 11/21/15 1557     Chief Complaint  Patient presents with  . Vaginal Discharge     (Consider location/radiation/quality/duration/timing/severity/associated sxs/prior Treatment) Patient is a 29 y.o. female presenting with vaginal discharge. The history is provided by the patient.  Vaginal Discharge Associated symptoms: no abdominal pain, no dysuria, no fever, no nausea and no vomiting    patient with complaint of vaginal discharge on and off for a year came back yesterday. Patient also with concerns for STD exposure. Denies any abdominal pain pelvic pain and vaginal bleeding. States that the discharge is clear. Denies any back pain dysuria. No fevers. No rash.  Past Medical History  Diagnosis Date  . Diabetes mellitus without complication (HCC)   . Abnormal Pap smear   . HPV (human papilloma virus) anogenital infection   . Gestational diabetes     diet controlled   Past Surgical History  Procedure Laterality Date  . Cholecystectomy     Family History  Problem Relation Age of Onset  . Cancer Maternal Aunt     breast  . Diabetes Maternal Aunt   . Heart disease Maternal Aunt   . Diabetes Maternal Uncle   . Heart disease Maternal Uncle   . Cancer Paternal Aunt     breast  . Diabetes Paternal Aunt   . Heart disease Paternal Aunt   . Diabetes Paternal Uncle   . Heart disease Paternal Uncle   . Cancer Paternal Grandmother     breast   Social History  Substance Use Topics  . Smoking status: Never Smoker   . Smokeless tobacco: Never Used  . Alcohol Use: No   OB History    Gravida Para Term Preterm AB TAB SAB Ectopic Multiple Living   Review of Systems  Constitutional: Negative for fever.  HENT: Negative for congestion.   Eyes: Negative for redness.  Respiratory: Negative for shortness of breath.   Cardiovascular: Negative for chest pain.   Gastrointestinal: Negative for nausea, vomiting and abdominal pain.  Genitourinary: Positive for vaginal discharge. Negative for dysuria and vaginal bleeding.  Skin: Negative for rash.  Hematological: Does not bruise/bleed easily.  Psychiatric/Behavioral: Negative for confusion.      Allergies  Latex and Tramadol  Home Medications   Prior to Admission medications   Medication Sig Start Date End Date Taking? Authorizing Provider  levonorgestrel (MIRENA) 20 MCG/24HR IUD 1 each by Intrauterine route once.   Yes Historical Provider, MD  cephALEXin (KEFLEX) 500 MG capsule Take 1 capsule (500 mg total) by mouth 4 (four) times daily. 09/20/14   Gilda Crease, MD  ibuprofen (ADVIL,MOTRIN) 600 MG tablet Take 1 tablet (600 mg total) by mouth every 6 (six) hours. 01/25/13   Huel Cote, MD  oxyCODONE-acetaminophen (PERCOCET/ROXICET) 5-325 MG per tablet Take 1-2 tablets by mouth every 4 (four) hours as needed. 01/25/13   Huel Cote, MD   BP 130/81 mmHg  Pulse 84  Temp(Src) 98.5 F (36.9 C) (Oral)  Resp 18  Ht  (1.6 m)  Wt 90.81 kg  BMI 35.47 kg/m2  SpO2 100% Physical Exam  Constitutional: She is oriented to person, place, and time. She appears well-developed and well-nourished. No distress.  HENT:  Head: Normocephalic and atraumatic.  Mouth/Throat: Oropharynx is clear and moist.  Eyes: Conjunctivae and EOM are  normal. Pupils are equal, round, and reactive to light.  Neck: Normal range of motion. Neck supple.  Cardiovascular: Normal rate, regular rhythm and normal heart sounds.   No murmur heard. Pulmonary/Chest: Effort normal and breath sounds normal. No respiratory distress.  Abdominal: Soft. Bowel sounds are normal. There is no tenderness.  Genitourinary: Uterus normal. Vaginal discharge found.  External genitalia is normal. Slight whitish vaginal discharge no cervical motion tenderness no uterine tenderness no adnexal tenderness.  Musculoskeletal: Normal range  of motion. She exhibits no edema.  Neurological: She is alert and oriented to person, place, and time. No cranial nerve deficit. She exhibits normal muscle tone.  Skin: Skin is warm. No erythema.  Nursing note and vitals reviewed.   ED Course  Procedures (including critical care time) Labs Review Labs Reviewed  WET PREP, GENITAL - Abnormal; Notable for the following:    Trich, Wet Prep PRESENT (*)    WBC, Wet Prep HPF POC MODERATE (*)    All other components within normal limits  URINALYSIS, ROUTINE W REFLEX MICROSCOPIC (NOT AT [facility]) - Abnormal; Notable for the following:    APPearance CLOUDY (*)    Hgb urine dipstick LARGE (*)    Leukocytes, UA LARGE (*)    All other components within normal limits  URINE MICROSCOPIC-ADD ON - Abnormal; Notable for the following:    Squamous Epithelial / LPF 6-30 (*)    Bacteria, UA FEW (*)    All other components within normal limits  URINE CULTURE  PREGNANCY, URINE  RAPID HIV SCREEN (HIV 1/2 AB+AG)  GC/CHLAMYDIA PROBE AMP ([place]) NOT AT [facility]   Results for orders placed or performed during the hospital encounter of 11/21/15  Wet prep, genital  Result Value Ref Range   Yeast Wet Prep HPF POC NONE SEEN NONE SEEN   Trich, Wet Prep PRESENT (A) NONE SEEN   Clue Cells Wet Prep HPF POC NONE SEEN NONE SEEN   WBC, Wet Prep HPF POC MODERATE (A) NONE SEEN   Sperm NONE SEEN   Urinalysis, Routine w reflex microscopic (not at [facility])  Result Value Ref Range   Color, Urine YELLOW YELLOW   APPearance CLOUDY (A) CLEAR   Specific Gravity, Urine 1.009 1.005 - 1.030   pH 7.0 5.0 - 8.0   Glucose, UA NEGATIVE NEGATIVE mg/dL   Hgb urine dipstick LARGE (A) NEGATIVE   Bilirubin Urine NEGATIVE NEGATIVE   Ketones, ur NEGATIVE NEGATIVE mg/dL   Protein, ur NEGATIVE NEGATIVE mg/dL   Nitrite NEGATIVE NEGATIVE   Leukocytes, UA LARGE (A) NEGATIVE  Pregnancy, urine  Result Value Ref Range   Preg Test, Ur NEGATIVE NEGATIVE  Urine microscopic-add on  Result  Value Ref Range   Squamous Epithelial / LPF 6-30 (A) NONE SEEN   WBC, UA 6-30 0 - 5 WBC/hpf   RBC / HPF 6-30 0 - 5 RBC/hpf   Bacteria, UA FEW (A) NONE SEEN   Urine-Other MUCOUS PRESENT      Imaging Review No results found. I have personally reviewed and evaluated these images and lab results as part of my medical decision-making.   EKG Interpretation None      MDM   Final diagnoses:  Exposure to STD  Trichimoniasis    Patient with complaint of vaginal discharge on and off for a year. Seem to come back yesterday. Patient states the discharge clear. Patient also with STD exposure. And once treatment.  Pelvic exam without significant findings other than a slight whitish discharge. No cervical  motion tenderness no adnexal tenderness no uterine tenderness.  Patient will be treated prophylactically with Rocephin and Zithromax for STD coverage.  Wet prep still pending.  Wet prep shows evidence of trichomonas. Patient will also be treated with Flagyl. Patient counseled dissection partner needs treatment as well.  Vanetta MuldersScott Nasim Habeeb, MD 11/21/15 1640  Vanetta MuldersScott Terrell Ostrand, MD 11/21/15 1701

## 2015-11-21 NOTE — Discharge Instructions (Signed)
Return for any new or worse symptoms. Based on our findings of the trichomonas sexual partner would require treatment as well. Return for any new or worse symptoms or not improving over the next few days.

## 2015-11-21 NOTE — ED Notes (Signed)
MD at bedside. 

## 2015-11-21 NOTE — ED Notes (Signed)
Reports discharge that has been on and off for a year.  It came back yesterday.  Reports itching no odor.  Reports bf cheated on her.

## 2015-11-22 LAB — URINE CULTURE

## 2015-11-22 LAB — GC/CHLAMYDIA PROBE AMP (~~LOC~~) NOT AT ARMC
CHLAMYDIA, DNA PROBE: POSITIVE — AB
Neisseria Gonorrhea: NEGATIVE

## 2015-11-25 ENCOUNTER — Telehealth (HOSPITAL_COMMUNITY): Payer: Self-pay

## 2015-11-25 NOTE — Telephone Encounter (Signed)
Spoke with pt. Verified ID. Informed of labs. Treated per protocol. [employer] form faxed. Pt informed to abstain from sexual activity x 10 days and to notify partner for testing and treatment.  

## 2016-01-13 ENCOUNTER — Emergency Department (HOSPITAL_BASED_OUTPATIENT_CLINIC_OR_DEPARTMENT_OTHER): Payer: Managed Care, Other (non HMO)

## 2016-01-13 ENCOUNTER — Encounter (HOSPITAL_BASED_OUTPATIENT_CLINIC_OR_DEPARTMENT_OTHER): Payer: Self-pay | Admitting: *Deleted

## 2016-01-13 ENCOUNTER — Emergency Department (HOSPITAL_BASED_OUTPATIENT_CLINIC_OR_DEPARTMENT_OTHER)
Admission: EM | Admit: 2016-01-13 | Discharge: 2016-01-13 | Disposition: A | Discharge status: Home / Self Care | Payer: Managed Care, Other (non HMO) | Attending: Emergency Medicine | Admitting: Emergency Medicine

## 2016-01-13 DIAGNOSIS — Z3202 Encounter for pregnancy test, result negative: Secondary | ICD-10-CM | POA: Diagnosis not present

## 2016-01-13 DIAGNOSIS — R109 Unspecified abdominal pain: Secondary | ICD-10-CM | POA: Insufficient documentation

## 2016-01-13 DIAGNOSIS — E119 Type 2 diabetes mellitus without complications: Secondary | ICD-10-CM | POA: Insufficient documentation

## 2016-01-13 DIAGNOSIS — Z9049 Acquired absence of other specified parts of digestive tract: Secondary | ICD-10-CM | POA: Diagnosis not present

## 2016-01-13 DIAGNOSIS — Z792 Long term (current) use of antibiotics: Secondary | ICD-10-CM | POA: Diagnosis not present

## 2016-01-13 DIAGNOSIS — Z91040 Latex allergy status: Secondary | ICD-10-CM | POA: Insufficient documentation

## 2016-01-13 DIAGNOSIS — Z8632 Personal history of gestational diabetes: Secondary | ICD-10-CM | POA: Insufficient documentation

## 2016-01-13 LAB — URINE MICROSCOPIC-ADD ON

## 2016-01-13 LAB — COMPREHENSIVE METABOLIC PANEL
ALBUMIN: 4.1 g/dL (ref 3.5–5.0)
ALT: 24 U/L (ref 14–54)
ANION GAP: 6 (ref 5–15)
AST: 25 U/L (ref 15–41)
Alkaline Phosphatase: 68 U/L (ref 38–126)
BILIRUBIN TOTAL: 0.4 mg/dL (ref 0.3–1.2)
BUN: 8 mg/dL (ref 6–20)
CHLORIDE: 103 mmol/L (ref 101–111)
CO2: 31 mmol/L (ref 22–32)
Calcium: 9.1 mg/dL (ref 8.9–10.3)
Creatinine, Ser: 0.81 mg/dL (ref 0.44–1.00)
GFR calc Af Amer: >60 mL/min (ref >60)
GLUCOSE: 125 mg/dL — AB (ref 65–99)
POTASSIUM: 4 mmol/L (ref 3.5–5.1)
Sodium: 140 mmol/L (ref 135–145)
TOTAL PROTEIN: 7.5 g/dL (ref 6.5–8.1)

## 2016-01-13 LAB — URINALYSIS, ROUTINE W REFLEX MICROSCOPIC
BILIRUBIN URINE: NEGATIVE
Glucose, UA: NEGATIVE mg/dL
KETONES UR: NEGATIVE mg/dL
NITRITE: NEGATIVE
Protein, ur: NEGATIVE mg/dL
Specific Gravity, Urine: 1.017 (ref 1.005–1.030)
pH: 8 (ref 5.0–8.0)

## 2016-01-13 LAB — CBC
HCT: 40 % (ref 36.0–46.0)
Hemoglobin: 13.3 g/dL (ref 12.0–15.0)
MCH: 28.4 pg (ref 26.0–34.0)
MCHC: 33.3 g/dL (ref 30.0–36.0)
MCV: 85.3 fL (ref 78.0–100.0)
PLATELETS: 384 10*3/uL (ref 150–400)
RBC: 4.69 MIL/uL (ref 3.87–5.11)
RDW: 12.4 % (ref 11.5–15.5)
WBC: 6.3 10*3/uL (ref 4.0–10.5)

## 2016-01-13 LAB — LIPASE, BLOOD: LIPASE: 36 U/L (ref 11–51)

## 2016-01-13 LAB — PREGNANCY, URINE: PREG TEST UR: NEGATIVE

## 2016-01-13 MED ORDER — GI COCKTAIL ~~LOC~~
30.0000 mL | Freq: Once | ORAL | Status: AC
  Administered 2016-01-13: 30 mL via ORAL
  Filled 2016-01-13: qty 30

## 2016-01-13 MED ORDER — ACETAMINOPHEN 325 MG PO TABS
650.0000 mg | ORAL_TABLET | Freq: Once | ORAL | Status: AC
  Administered 2016-01-13: 650 mg via ORAL
  Filled 2016-01-13: qty 2

## 2016-01-13 NOTE — ED Notes (Signed)
Sharp mid abdominal pain on and off for a week.

## 2016-01-13 NOTE — Discharge Instructions (Signed)
It was our pleasure to provide your ER care today - we hope that you feel better.  You may try pepcid and/or maalox as need for symptom relief.  Follow up with primary care doctor in coming week for recheck if symptoms fail to improve/resolve.  Return to ER if worse, new symptoms, fevers, worsening or severe pain, persistent vomiting, other concern.       Abdominal Pain, Adult Many things can cause abdominal pain. Usually, abdominal pain is not caused by a disease and will improve without treatment. It can often be observed and treated at home. Your health care provider will do a physical exam and possibly order blood tests and X-rays to help determine the seriousness of your pain. However, in many cases, more time must pass before a clear cause of the pain can be found. Before that point, your health care provider may not know if you need more testing or further treatment. HOME CARE INSTRUCTIONS Monitor your abdominal pain for any changes. The following actions may help to alleviate any discomfort you are experiencing:  Only take over-the-counter or prescription medicines as directed by your health care provider.  Do not take laxatives unless directed to do so by your health care provider.  Try a clear liquid diet (broth, tea, or water) as directed by your health care provider. Slowly move to a bland diet as tolerated. SEEK MEDICAL CARE IF:  You have unexplained abdominal pain.  You have abdominal pain associated with nausea or diarrhea.  You have pain when you urinate or have a bowel movement.  You experience abdominal pain that wakes you in the night.  You have abdominal pain that is worsened or improved by eating food.  You have abdominal pain that is worsened with eating fatty foods.  You have a fever. SEEK IMMEDIATE MEDICAL CARE IF:  Your pain does not go away within 2 hours.  You keep throwing up (vomiting).  Your pain is felt only in portions of the abdomen, such  as the right side or the left lower portion of the abdomen.  You pass bloody or black tarry stools. MAKE SURE YOU:  Understand these instructions.  Will watch your condition.  Will get help right away if you are not doing well or get worse.   This information is not intended to replace advice given to you by your health care provider. Make sure you discuss any questions you have with your health care provider.   Document Released: 09/09/2005 Document Revised: 08/21/2015 Document Reviewed: 08/09/2013 Elsevier Interactive Patient Education [employer]

## 2016-01-13 NOTE — ED Provider Notes (Signed)
CSN: [id number]     Arrival date & time 01/13/16  1056 History   First MD Initiated Contact with Patient 01/13/16 1124     Chief Complaint  Patient presents with  . Abdominal Pain     (Consider location/radiation/quality/duration/timing/severity/associated sxs/prior Treatment) Patient is a 30 y.o. female presenting with abdominal pain. The history is provided by the patient.  Abdominal Pain Associated symptoms: no chest pain, no chills, no constipation, no cough, no diarrhea, no dysuria, no fever, no shortness of breath, no sore throat, no vaginal bleeding, no vaginal discharge and no vomiting   Patient c/o mid abd pain for the past 5 days. Pain intermittent, occurs randomly, at rest. No relation to eating. No relation to activity/exertion, or other specific activity. Lasts of variable duration.  No hx same pain. Normal appetite. No nvd. Had normal bm today. No abd distension.  Prior abd surgery is remote hx cholecystectomy.  Has iud, no vaginal discharge or bleeding. No back or flank pain. No dysuria or gu c/o. No fall or abd trauma. No fever or chills. No cough or uri c/o. No chest pain.      Past Medical History  Diagnosis Date  . Diabetes mellitus without complication (HCC)   . Abnormal Pap smear   . HPV (human papilloma virus) anogenital infection   . Gestational diabetes     diet controlled   Past Surgical History  Procedure Laterality Date  . Cholecystectomy     Family History  Problem Relation Age of Onset  . Cancer Maternal Aunt     breast  . Diabetes Maternal Aunt   . Heart disease Maternal Aunt   . Diabetes Maternal Uncle   . Heart disease Maternal Uncle   . Cancer Paternal Aunt     breast  . Diabetes Paternal Aunt   . Heart disease Paternal Aunt   . Diabetes Paternal Uncle   . Heart disease Paternal Uncle   . Cancer Paternal Grandmother     breast   Social History  Substance Use Topics  . Smoking status: Never Smoker   . Smokeless tobacco: Never Used  .  Alcohol Use: No   OB History    Gravida Para Term Preterm AB TAB SAB Ectopic Multiple Living   Review of Systems  Constitutional: Negative for fever and chills.  HENT: Negative for sore throat.   Eyes: Negative for redness.  Respiratory: Negative for cough and shortness of breath.   Cardiovascular: Negative for chest pain.  Gastrointestinal: Positive for abdominal pain. Negative for vomiting, diarrhea and constipation.  Genitourinary: Negative for dysuria, flank pain, vaginal bleeding and vaginal discharge.  Musculoskeletal: Negative for back pain and neck pain.  Skin: Negative for rash.  Neurological: Negative for headaches.  Hematological: Does not bruise/bleed easily.  Psychiatric/Behavioral: Negative for confusion.      Allergies  Latex and Tramadol  Home Medications   Prior to Admission medications   Medication Sig Start Date End Date Taking? Authorizing Provider  cephALEXin (KEFLEX) 500 MG capsule Take 1 capsule (500 mg total) by mouth 4 (four) times daily. 09/20/14   Gilda Crease, MD  ibuprofen (ADVIL,MOTRIN) 600 MG tablet Take 1 tablet (600 mg total) by mouth every 6 (six) hours. 01/25/13   Huel Cote, MD  levonorgestrel (MIRENA) 20 MCG/24HR IUD 1 each by Intrauterine route once.    Historical Provider, MD  oxyCODONE-acetaminophen (PERCOCET/ROXICET) 5-325 MG per tablet Take 1-2  tablets by mouth every 4 (four) hours as needed. 01/25/13   Huel Cote, MD   BP 126/77 mmHg  Pulse 74  Temp(Src) 98.1 F (36.7 C) (Oral)  Resp 16  Ht  (1.6 m)  Wt 90.719 kg  BMI 35.44 kg/m2  SpO2 100% Physical Exam  Constitutional: She appears well-developed and well-nourished. No distress.  HENT:  Mouth/Throat: Oropharynx is clear and moist.  Eyes: Conjunctivae are normal. No scleral icterus.  Neck: Neck supple. No tracheal deviation present.  Cardiovascular: Normal rate, regular rhythm, normal heart sounds and intact distal pulses.  Exam  reveals no gallop and no friction rub.   No murmur heard. Pulmonary/Chest: Effort normal and breath sounds normal. No respiratory distress.  Abdominal: Soft. Normal appearance and bowel sounds are normal. She exhibits no distension and no mass. There is no tenderness. There is no rebound and no guarding.  No incarc hernia.   Genitourinary:  No cva tenderness  Musculoskeletal: She exhibits no edema.  Neurological: She is alert.  Skin: Skin is warm and dry. No rash noted. She is not diaphoretic.  Psychiatric: She has a normal mood and affect.  Nursing note and vitals reviewed.   ED Course  Procedures (including critical care time) Labs Review  Results for orders placed or performed during the hospital encounter of 01/13/16  Urinalysis, Routine w reflex microscopic (not at [facility])  Result Value Ref Range   Color, Urine YELLOW YELLOW   APPearance CLOUDY (A) CLEAR   Specific Gravity, Urine 1.017 1.005 - 1.030   pH 8.0 5.0 - 8.0   Glucose, UA NEGATIVE NEGATIVE mg/dL   Hgb urine dipstick LARGE (A) NEGATIVE   Bilirubin Urine NEGATIVE NEGATIVE   Ketones, ur NEGATIVE NEGATIVE mg/dL   Protein, ur NEGATIVE NEGATIVE mg/dL   Nitrite NEGATIVE NEGATIVE   Leukocytes, UA SMALL (A) NEGATIVE  Pregnancy, urine  Result Value Ref Range   Preg Test, Ur NEGATIVE NEGATIVE  CBC  Result Value Ref Range   WBC 6.3 4.0 - 10.5 K/uL   RBC 4.69 3.87 - 5.11 MIL/uL   Hemoglobin 13.3 12.0 - 15.0 g/dL   HCT 47.8 29.5 - 62.1 %   MCV 85.3 78.0 - 100.0 fL   MCH 28.4 26.0 - 34.0 pg   MCHC 33.3 30.0 - 36.0 g/dL   RDW 30.8 65.7 - 84.6 %   Platelets 384 150 - 400 K/uL  Comprehensive metabolic panel  Result Value Ref Range   Sodium 140 135 - 145 mmol/L   Potassium 4.0 3.5 - 5.1 mmol/L   Chloride 103 101 - 111 mmol/L   CO2 31 22 - 32 mmol/L   Glucose, Bld 125 (H) 65 - 99 mg/dL   BUN 8 6 - 20 mg/dL   Creatinine, Ser 9.62 0.44 - 1.00 mg/dL   Calcium 9.1 8.9 - 95.2 mg/dL   Total Protein 7.5 6.5 - 8.1 g/dL    Albumin 4.1 3.5 - 5.0 g/dL   AST 25 15 - 41 U/L   ALT 24 14 - 54 U/L   Alkaline Phosphatase 68 38 - 126 U/L   Total Bilirubin 0.4 0.3 - 1.2 mg/dL   GFR calc non Af Amer >60 >60 mL/min   GFR calc Af Amer >60 >60 mL/min   Anion gap 6 5 - 15  Lipase, blood  Result Value Ref Range   Lipase 36 11 - 51 U/L  Urine microscopic-add on  Result Value Ref Range   Squamous Epithelial / LPF 6-30 (A) NONE  SEEN   WBC, UA 0-5 0 - 5 WBC/hpf   RBC / HPF 6-30 0 - 5 RBC/hpf   Bacteria, UA FEW (A) NONE SEEN   Urine-Other MUCOUS PRESENT    Dg Abd 1 View  01/13/2016  CLINICAL DATA:  Epigastric pain for 5 days. History of cholecystectomy and IUD. EXAM: ABDOMEN - 1 VIEW COMPARISON:  None. FINDINGS: Visualized bowel gas pattern is nonobstructive. No evidence of soft tissue mass or abnormal fluid collection. No evidence of free intraperitoneal air. No pathologic- appearing calcifications seen. Cholecystectomy clips partially imaged at the upper aspects of the exam. Intrauterine device within the midline pelvis. Osseous structures are unremarkable. IMPRESSION: No acute findings.  Nonobstructive bowel gas pattern. Electronically Signed   By: Bary Richard M.D.   On: 01/13/2016 12:02       I have personally reviewed and evaluated these images and lab results as part of my medical decision-making.   MDM   Labs.   Reviewed nursing notes and prior charts for additional history.   Gi cocktail and tylenol for symptom relief.  Recheck, relief w gi meds. Pt resting comfortably.   abd soft nt. Afeb.  Pt currently appears stable for d/c.     Cathren Laine, MD 01/13/16 1256

## 2016-03-21 ENCOUNTER — Ambulatory Visit (HOSPITAL_COMMUNITY)
Admission: EM | Admit: 2016-03-21 | Discharge: 2016-03-21 | Disposition: A | Discharge status: Home / Self Care | Payer: Managed Care, Other (non HMO) | Attending: Family Medicine | Admitting: Family Medicine

## 2016-03-21 ENCOUNTER — Encounter (HOSPITAL_COMMUNITY): Payer: Self-pay | Admitting: *Deleted

## 2016-03-21 DIAGNOSIS — Z202 Contact with and (suspected) exposure to infections with a predominantly sexual mode of transmission: Secondary | ICD-10-CM | POA: Diagnosis not present

## 2016-03-21 DIAGNOSIS — Z9049 Acquired absence of other specified parts of digestive tract: Secondary | ICD-10-CM | POA: Diagnosis not present

## 2016-03-21 DIAGNOSIS — A630 Anogenital (venereal) warts: Secondary | ICD-10-CM | POA: Insufficient documentation

## 2016-03-21 DIAGNOSIS — E119 Type 2 diabetes mellitus without complications: Secondary | ICD-10-CM | POA: Insufficient documentation

## 2016-03-21 DIAGNOSIS — Z79899 Other long term (current) drug therapy: Secondary | ICD-10-CM | POA: Diagnosis not present

## 2016-03-21 MED ORDER — CEFTRIAXONE SODIUM 250 MG IJ SOLR
INTRAMUSCULAR | Status: AC
  Filled 2016-03-21: qty 250

## 2016-03-21 MED ORDER — CEFTRIAXONE SODIUM 250 MG IJ SOLR
250.0000 mg | Freq: Once | INTRAMUSCULAR | Status: AC
  Administered 2016-03-21: 250 mg via INTRAMUSCULAR

## 2016-03-21 MED ORDER — AZITHROMYCIN 250 MG PO TABS
1000.0000 mg | ORAL_TABLET | Freq: Once | ORAL | Status: AC
  Administered 2016-03-21: 1000 mg via ORAL

## 2016-03-21 MED ORDER — AZITHROMYCIN 250 MG PO TABS
ORAL_TABLET | ORAL | Status: AC
  Filled 2016-03-21: qty 4

## 2016-03-21 NOTE — ED Provider Notes (Signed)
CSN: [id number]     Arrival date & time 03/21/16  1719 History   First MD Initiated Contact with Patient 03/21/16 1814     Chief Complaint  Patient presents with  . Exposure to STD   (Consider location/radiation/quality/duration/timing/severity/associated sxs/prior Treatment) Patient is a 30 y.o. female presenting with STD exposure. The history is provided by the patient.  Exposure to STD This is a new problem. The current episode started yesterday (told by partner that he had chlamydia, here for check.). The problem has not changed since onset.Pertinent negatives include no chest pain, no abdominal pain and no shortness of breath.    Past Medical History  Diagnosis Date  . Diabetes mellitus without complication (HCC)   . Abnormal Pap smear   . HPV (human papilloma virus) anogenital infection   . Gestational diabetes     diet controlled   Past Surgical History  Procedure Laterality Date  . Cholecystectomy     Family History  Problem Relation Age of Onset  . Cancer Maternal Aunt     breast  . Diabetes Maternal Aunt   . Heart disease Maternal Aunt   . Diabetes Maternal Uncle   . Heart disease Maternal Uncle   . Cancer Paternal Aunt     breast  . Diabetes Paternal Aunt   . Heart disease Paternal Aunt   . Diabetes Paternal Uncle   . Heart disease Paternal Uncle   . Cancer Paternal Grandmother     breast   Social History  Substance Use Topics  . Smoking status: Never Smoker   . Smokeless tobacco: Never Used  . Alcohol Use: No   OB History    Gravida Para Term Preterm AB TAB SAB Ectopic Multiple Living   Review of Systems  Respiratory: Negative for shortness of breath.   Cardiovascular: Negative for chest pain.  Gastrointestinal: Negative.  Negative for abdominal pain.  Genitourinary: Negative.  Negative for vaginal discharge and vaginal pain.  All other systems reviewed and are negative.   Allergies  Latex and Tramadol  Home Medications    Prior to Admission medications   Medication Sig Start Date End Date Taking? Authorizing Provider  cephALEXin (KEFLEX) 500 MG capsule Take 1 capsule (500 mg total) by mouth 4 (four) times daily. 09/20/14   Gilda Crease, MD  ibuprofen (ADVIL,MOTRIN) 600 MG tablet Take 1 tablet (600 mg total) by mouth every 6 (six) hours. 01/25/13   Huel Cote, MD  levonorgestrel (MIRENA) 20 MCG/24HR IUD 1 each by Intrauterine route once.    Historical Provider, MD  oxyCODONE-acetaminophen (PERCOCET/ROXICET) 5-325 MG per tablet Take 1-2 tablets by mouth every 4 (four) hours as needed. 01/25/13   Huel Cote, MD   Meds Ordered and Administered this Visit   Medications  cefTRIAXone (ROCEPHIN) injection 250 mg (250 mg Intramuscular Given 03/21/16 1846)  azithromycin (ZITHROMAX) tablet 1,000 mg (1,000 mg Oral Given 03/21/16 1846)    BP 119/66 mmHg  Pulse 97  Temp(Src) 98.7 F (37.1 C) (Oral)  Resp 16  SpO2 98% No data found.   Physical Exam  Constitutional: She is oriented to person, place, and time. She appears well-developed and well-nourished. No distress.  Abdominal: Soft. Bowel sounds are normal. There is no tenderness.  Genitourinary: Vagina normal. No vaginal discharge found.  No vaginal bumps seen, pt agrees that it is resolved.  Neurological: She is alert and oriented to person, place, and time.  Skin: Skin is warm and dry.  Nursing note and vitals reviewed.   ED Course  Procedures (including critical care time)  Labs Review Labs Reviewed  URINE CYTOLOGY ANCILLARY ONLY    Imaging Review No results found.   Visual Acuity Review  Right Eye Distance:   Left Eye Distance:   Bilateral Distance:    Right Eye Near:   Left Eye Near:    Bilateral Near:         MDM   1. Possible exposure to STD        James D KindlLinna Hoff, MD 03/21/16 [phone]

## 2016-03-21 NOTE — Discharge Instructions (Signed)
We will call with test results and treat as indicated °

## 2016-03-21 NOTE — ED Notes (Signed)
Pt  States    She   Was told  By  Partner  That  He  Had  chlymydia     She  denys  Any  Discharge  However  Has  A  Bump  On  Her  Vagina

## 2016-03-21 NOTE — ED Notes (Signed)
Phone  Number    Verified          Plan  Of  Care       Reviewed    With  Pt

## 2016-03-23 LAB — URINE CYTOLOGY ANCILLARY ONLY
Chlamydia: POSITIVE — AB
NEISSERIA GONORRHEA: NEGATIVE
TRICH (WINDOWPATH): NEGATIVE

## 2016-03-25 ENCOUNTER — Telehealth (HOSPITAL_COMMUNITY): Payer: Self-pay | Admitting: Emergency Medicine

## 2016-03-25 NOTE — ED Notes (Signed)
Called pt and notified of recent lab results from visit 03/21/16 Pt ID'd properly... Reports feeling better and sx have subsided  Per Dr. Dayton ScrapeMurray,  Please let patient and health department know that test for chlamydia was positive; this was treated at the urgent care on 03/21/16 with zithromax 1g po. Tests for gonorrhea and trichomonas were negative. Recheck for persistent symptoms. LM  Adv pt if sx are not getting better to return  Pt verb understanding Education on safe sex given [facility]

## 2016-03-26 LAB — URINE CYTOLOGY ANCILLARY ONLY

## 2016-03-30 MED ORDER — METRONIDAZOLE 500 MG PO TABS: 500.0000 mg | ORAL_TABLET | Freq: Two times a day (BID) | ORAL | Status: DC

## 2016-03-30 NOTE — ED Notes (Signed)
Called [phone]; was hanged up the first time... Called again but VM was full.  Need to give lab results from recent visit on 4/11  Per Dr. Dayton ScrapeMurray,  Patient [facility]. Tests for gardnerella (bacterial vaginosis) has returned positive. If not having vaginal discharge/irritation, does not require treatment. (If having vaginal discharge/irritation, would treat with metronidazole 500mg  bid x 7d #14 no refills.)  Recheck for further evaluation as needed. LM

## 2016-03-30 NOTE — ED Notes (Signed)
Pt called back.... notified of recent lab results from visit 03/21/16 Pt ID'd properly... Reports feeling better but still having vag d/c... Reports she has also notified partner Per her request.... E-Rx Flagyl 500 mg BID x7 days to [facility]  Per Dr. Dayton ScrapeMurray,  Patient [facility]. Tests for gardnerella (bacterial vaginosis) has returned positive. If not having vaginal discharge/irritation, does not require treatment. (If having vaginal discharge/irritation, would treat with metronidazole 500mg  bid x 7d #14 no refills.)  Recheck for further evaluation as needed. LM  Please let patient and health department know that test for chlamydia was positive; this was treated at the urgent care on 03/21/16 with zithromax 1g po. Tests for gonorrhea and trichomonas were negative. Recheck for persistent symptoms. LM  Adv pt if sx are not getting better to return  Pt verb understanding Education on safe sex given [facility]

## 2016-05-11 ENCOUNTER — Encounter (HOSPITAL_COMMUNITY): Payer: Self-pay | Admitting: Emergency Medicine

## 2016-05-11 ENCOUNTER — Emergency Department (HOSPITAL_COMMUNITY)
Admission: EM | Admit: 2016-05-11 | Discharge: 2016-05-11 | Disposition: A | Discharge status: Home / Self Care | Payer: Managed Care, Other (non HMO) | Attending: Emergency Medicine | Admitting: Emergency Medicine

## 2016-05-11 DIAGNOSIS — Z91040 Latex allergy status: Secondary | ICD-10-CM | POA: Insufficient documentation

## 2016-05-11 DIAGNOSIS — J029 Acute pharyngitis, unspecified: Secondary | ICD-10-CM

## 2016-05-11 DIAGNOSIS — Z8632 Personal history of gestational diabetes: Secondary | ICD-10-CM | POA: Insufficient documentation

## 2016-05-11 DIAGNOSIS — Z792 Long term (current) use of antibiotics: Secondary | ICD-10-CM | POA: Insufficient documentation

## 2016-05-11 DIAGNOSIS — Z8619 Personal history of other infectious and parasitic diseases: Secondary | ICD-10-CM | POA: Insufficient documentation

## 2016-05-11 DIAGNOSIS — E119 Type 2 diabetes mellitus without complications: Secondary | ICD-10-CM | POA: Insufficient documentation

## 2016-05-11 LAB — RAPID STREP SCREEN (MED CTR MEBANE ONLY): STREPTOCOCCUS, GROUP A SCREEN (DIRECT): NEGATIVE

## 2016-05-11 MED ORDER — DEXAMETHASONE 4 MG PO TABS
10.0000 mg | ORAL_TABLET | Freq: Once | ORAL | Status: AC
  Administered 2016-05-11: 10 mg via ORAL
  Filled 2016-05-11: qty 3

## 2016-05-11 NOTE — ED Provider Notes (Signed)
CSN: [id number]     Arrival date & time 05/11/16  0725 History   First MD Initiated Contact with Patient 05/11/16 [phone]     Chief Complaint  Patient presents with  . Sore Throat      HPI 30 year old female presents today with complaints of sore throat. Patient reports approximately 1 week ago she had URI-type symptoms, she reports that those are resolved but maintains that she saw the sore throat. Patient reports painful swallowing, sore anterior cervical lymph nodes. She denies any fever, chills, vomiting, difficulty swallowing or breathing. No pooling of secretions. No history of the same. 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    Past Medical History  Diagnosis Date  . Diabetes mellitus without complication (HCC)   . Abnormal Pap smear   . HPV (human papilloma virus) anogenital infection   . Gestational diabetes     diet controlled   Past Surgical History  Procedure Laterality Date  . Cholecystectomy     Family History  Problem Relation Age of Onset  . Cancer Maternal Aunt     breast  . Diabetes Maternal Aunt   . Heart disease Maternal Aunt   . Diabetes Maternal Uncle   . Heart disease Maternal Uncle   . Cancer Paternal Aunt     breast  . Diabetes Paternal Aunt   . Heart disease Paternal Aunt   . Diabetes Paternal Uncle   . Heart disease Paternal Uncle   . Cancer Paternal Grandmother     breast   Social History  Substance Use Topics  . Smoking status: Never Smoker   . Smokeless tobacco: Never Used  . Alcohol Use: No   OB History    Gravida Para Term Preterm AB TAB SAB Ectopic Multiple Living   Review of Systems  All other systems reviewed and are negative.   Allergies  Latex and Tramadol  Home Medications   Prior to Admission medications   Medication Sig Start Date End Date Taking? Authorizing Provider  cephALEXin (KEFLEX) 500 MG capsule Take 1 capsule (500 mg total) by mouth 4 (four) times daily. 09/20/14   Gilda Crease, MD  ibuprofen (ADVIL,MOTRIN) 600 MG tablet Take 1 tablet (600 mg total) by mouth every 6 (six) hours. 01/25/13   Huel Cote, MD  levonorgestrel (MIRENA) 20 MCG/24HR IUD 1 each by Intrauterine route once.    Historical Provider, MD  metroNIDAZOLE (FLAGYL) 500 MG tablet Take 1 tablet (500 mg total) by mouth 2 (two) times daily. 03/30/16   Eustace Moore, MD  oxyCODONE-acetaminophen (PERCOCET/ROXICET) 5-325 MG per tablet Take 1-2 tablets by mouth every 4 (four) hours as needed. 01/25/13   Huel Cote, MD   BP 132/100 mmHg  Pulse 82  Temp(Src) 98.5 F (36.9 C)  Resp 18  SpO2 100%  Breastfeeding? No Physical Exam  Constitutional: She is oriented to person, place, and time. She appears well-developed and well-nourished.  HENT:  Head: Normocephalic and atraumatic.  Mildly bilateral enlarged tonsils, symmetrical, no signs of PTA, no exudate, no retropharyngeal abscess, uvula is midline and rises with phonation, no lesions noted, no erythema.  Eyes: Conjunctivae are normal. Pupils are equal, round, and reactive to light. Right eye exhibits no discharge. Left eye exhibits no discharge. No scleral icterus.  Neck: Normal range of motion. No JVD present. No tracheal deviation present.  Cardiovascular: Normal rate, regular rhythm, normal heart sounds and intact distal  pulses.  Exam reveals no gallop and no friction rub.   No murmur heard. Pulmonary/Chest: Effort normal and breath sounds normal. No stridor. No respiratory distress. She has no wheezes. She has no rales. She exhibits no tenderness.  Neurological: She is alert and oriented to person, place, and time. Coordination normal.  Skin: Skin is warm and dry. No rash noted. No erythema. No pallor.  Psychiatric: She has a normal mood and affect. Her behavior is normal. Judgment and thought content normal.  Nursing note and vitals reviewed.   ED Course  Procedures (including critical care time) Labs Review Labs Reviewed  RAPID  STREP SCREEN (NOT AT [facility])  CULTURE, GROUP A STREP [facility])    Imaging Review No results found. I have personally reviewed and evaluated these images and lab results as part of my medical decision-making.   EKG Interpretation None      MDM   Final diagnoses:  Pharyngitis    Labs: Rapid strep  Imaging:  Consults:  Therapeutics:  Discharge Meds: Decadron  Assessment/Plan: Pt presents with likely viral pharyngitis. No difficulty swallowing, drooling, dysphonia,muffled voice, stridor, swelling of the neck, trismus, mouth pain, swelling/ pain in submandibular area or floor of mouth ,assymetry of tonsils, or ulcerations; unlikely epiglottitis, PTA, submandibular space infection, retropharyngeal space infection, or HIV. Pt treated here in the ED with therapeutics listed above, given strict return precautions, PCP follow-up for re-evaluation if symptoms persist beyond 5-7 days in duration, return to the ED if they worsen. Pt verbalized understanding and agreement to today's plan and had no further questions or concerns at the time of discharge.         Eyvonne MechanicJeffrey Veena Sturgess, PA-C 05/11/16 [phone]  Rolland PorterMark James, MD 05/21/16 [phone]

## 2016-05-11 NOTE — ED Notes (Signed)
Declined W/C at D/C and was escorted to lobby by RN. 

## 2016-05-11 NOTE — ED Notes (Signed)
Pt c/o sore throat x 1 week. Pt tried tylenol and excedrin without relief.

## 2016-05-11 NOTE — Discharge Instructions (Signed)

## 2016-05-13 LAB — CULTURE, GROUP A STREP (THRC)

## 2016-10-15 ENCOUNTER — Emergency Department (HOSPITAL_BASED_OUTPATIENT_CLINIC_OR_DEPARTMENT_OTHER)
Admission: EM | Admit: 2016-10-15 | Discharge: 2016-10-15 | Disposition: A | Discharge status: Home / Self Care | Payer: Managed Care, Other (non HMO) | Attending: Emergency Medicine | Admitting: Emergency Medicine

## 2016-10-15 ENCOUNTER — Encounter (HOSPITAL_BASED_OUTPATIENT_CLINIC_OR_DEPARTMENT_OTHER): Payer: Self-pay | Admitting: *Deleted

## 2016-10-15 DIAGNOSIS — Z91040 Latex allergy status: Secondary | ICD-10-CM | POA: Diagnosis not present

## 2016-10-15 DIAGNOSIS — J069 Acute upper respiratory infection, unspecified: Secondary | ICD-10-CM

## 2016-10-15 DIAGNOSIS — J040 Acute laryngitis: Secondary | ICD-10-CM

## 2016-10-15 DIAGNOSIS — J029 Acute pharyngitis, unspecified: Secondary | ICD-10-CM | POA: Diagnosis present

## 2016-10-15 LAB — RAPID STREP SCREEN (MED CTR MEBANE ONLY): Streptococcus, Group A Screen (Direct): NEGATIVE

## 2016-10-15 MED ORDER — FLUTICASONE PROPIONATE 50 MCG/ACT NA SUSP: 2.0000 | Freq: Every day | NASAL | 0 refills | Status: DC

## 2016-10-15 MED ORDER — CETIRIZINE HCL 10 MG PO TABS: 10.0000 mg | ORAL_TABLET | Freq: Every day | ORAL | 1 refills | Status: DC

## 2016-10-15 MED ORDER — ACETAMINOPHEN 325 MG PO TABS
650.0000 mg | ORAL_TABLET | Freq: Once | ORAL | Status: AC
  Administered 2016-10-15: 650 mg via ORAL
  Filled 2016-10-15: qty 2

## 2016-10-15 MED ORDER — DEXAMETHASONE SODIUM PHOSPHATE 10 MG/ML IJ SOLN
10.0000 mg | Freq: Once | INTRAMUSCULAR | Status: AC
  Administered 2016-10-15: 10 mg via INTRAMUSCULAR
  Filled 2016-10-15: qty 1

## 2016-10-15 MED ORDER — NAPROXEN 500 MG PO TABS: 500.0000 mg | ORAL_TABLET | Freq: Two times a day (BID) | ORAL | 0 refills | Status: DC

## 2016-10-15 NOTE — ED Notes (Signed)
Pt verbalizes understanding of d/c instructions and denies any further needs at this time. 

## 2016-10-15 NOTE — ED Provider Notes (Signed)
[facility] Provider Note   CSN: [id number] Arrival date & time: 10/15/16  2035   By signing my name below, I, Teofilo PodMatthew P. Jamison, attest that this documentation has been prepared under the direction and in the presence of Everlene FarrierWilliam Dariyon Urquilla, PA-C. Electronically Signed: Teofilo PodMatthew P. Jamison, ED Scribe. 10/15/2016. 10:49 PM.   History   Chief Complaint Chief Complaint  Patient presents with  . Sore Throat   The history is provided by the patient. No language interpreter was used.   HPI Comments:  Sigmund HazelChanning N Rihn is a 30 y.o. female who presents to the Emergency Department complaining of worsening URI symptoms x 1 week. Pt was seen at [facility] 2 days ago and was diagnosed with a URI. Pt complains of associated cough, congestion, post nasal drip, sore throat and pain with swallowing. No trouble swallowing. Pt states that this morning she woke up with a more severe sore throat, and notes that she is loosing her voice.  Pt has taken Bromfed DM and benzonatate with mild relief. Pt denies fever, vomiting, diarrhea, Trouble swallowing, neck pain, neck stiffness, ear discharge, drooling, SOB, cough, rashes.   Past Medical History:  Diagnosis Date  . Abnormal Pap smear   . HPV (human papilloma virus) anogenital infection     Patient Active Problem List   Diagnosis Date Noted  . NSVD (normal spontaneous vaginal delivery) 01/26/2013    Past Surgical History:  Procedure Laterality Date  . CHOLECYSTECTOMY      OB History    Gravida Para Term Preterm AB Living   2 2 2     2    SAB TAB Ectopic Multiple Live Births           2       Home Medications    Prior to Admission medications   Medication Sig Start Date End Date Taking? Authorizing Provider  cetirizine (ZYRTEC ALLERGY) 10 MG tablet Take 1 tablet (10 mg total) by mouth daily. 10/15/16   Everlene FarrierWilliam Jennife Zaucha, PA-C  fluticasone (FLONASE) 50 MCG/ACT nasal spray Place 2 sprays into both nostrils daily. 10/15/16   Everlene FarrierWilliam  Chozen Latulippe, PA-C  levonorgestrel (MIRENA) 20 MCG/24HR IUD 1 each by Intrauterine route once.    Historical Provider, MD  naproxen (NAPROSYN) 500 MG tablet Take 1 tablet (500 mg total) by mouth 2 (two) times daily with a meal. 10/15/16   Everlene FarrierWilliam Mabry Tift, PA-C    Family History Family History  Problem Relation Age of Onset  . Cancer Maternal Aunt     breast  . Diabetes Maternal Aunt   . Heart disease Maternal Aunt   . Diabetes Maternal Uncle   . Heart disease Maternal Uncle   . Cancer Paternal Aunt     breast  . Diabetes Paternal Aunt   . Heart disease Paternal Aunt   . Diabetes Paternal Uncle   . Heart disease Paternal Uncle   . Cancer Paternal Grandmother     breast    Social History Social History  Substance Use Topics  . Smoking status: Never Smoker  . Smokeless tobacco: Never Used  . Alcohol use No     Allergies   Latex and Tramadol   Review of Systems Review of Systems  Constitutional: Negative for chills and fever.  HENT: Positive for congestion, postnasal drip, rhinorrhea, sneezing, sore throat and voice change. Negative for drooling, ear pain, facial swelling, mouth sores and trouble swallowing.   Eyes: Negative for visual disturbance.  Respiratory: Positive for cough. Negative for shortness  of breath and wheezing.   Cardiovascular: Negative for chest pain and palpitations.  Gastrointestinal: Negative for abdominal pain, diarrhea and vomiting.  Musculoskeletal: Negative for myalgias.  Skin: Negative for rash.  Neurological: Negative for headaches.  All other systems reviewed and are negative.    Physical Exam Updated Vital Signs BP 118/80   Pulse 99   Temp 98.2 F (36.8 C) (Oral)   Resp 20   Ht 5\' 3"  (1.6 m)   Wt 90.7 kg   SpO2 100%   BMI 35.43 kg/m   Physical Exam  Constitutional: She appears well-developed and well-nourished. No distress.  Nontoxic appearing.  HENT:  Head: Normocephalic and atraumatic.  Right Ear: External ear normal.  Left  Ear: External ear normal.  Mouth/Throat: Oropharynx is clear and moist.  Mild middle ear effusion bilaterally. No TM erythema or loss of landmarks. Boggy nasal turbinates bilaterally.  Mild bilateral tonsillar hypertrophy without exudates.  No peritonsillar abscess, no trismus, no drooling. Uvula midline without edema.   Eyes: Conjunctivae are normal. Pupils are equal, round, and reactive to light. Right eye exhibits no discharge. Left eye exhibits no discharge.  Neck: Normal range of motion. Neck supple. No JVD present. No tracheal deviation present.  Cardiovascular: Normal rate, regular rhythm, normal heart sounds and intact distal pulses.  Exam reveals no gallop and no friction rub.   No murmur heard. Pulmonary/Chest: Effort normal and breath sounds normal. No stridor. No respiratory distress. She has no wheezes. She has no rales.  Lungs clear to ascultation bilaterally.   Abdominal: Soft. There is no tenderness.  Musculoskeletal: She exhibits no edema.  Lymphadenopathy:    She has no cervical adenopathy.  Neurological: She is alert. Coordination normal.  Skin: Skin is warm and dry. Capillary refill takes less than 2 seconds. No rash noted. She is not diaphoretic. No erythema. No pallor.  Psychiatric: She has a normal mood and affect. Her behavior is normal.  Nursing note and vitals reviewed.    ED Treatments / Results  DIAGNOSTIC STUDIES:  Oxygen Saturation is RA% on RA, normal by my interpretation.    COORDINATION OF CARE:  10:49 PM Discussed treatment plan with pt at bedside and pt agreed to plan.   Labs (all labs ordered are listed, but only abnormal results are displayed) Labs Reviewed  RAPID STREP SCREEN (NOT AT [facility])  CULTURE, GROUP A STREP [facility])    EKG  EKG Interpretation None       Radiology No results found.  Procedures Procedures (including critical care time)  Medications Ordered in ED Medications  dexamethasone (DECADRON) injection 10 mg (10 mg  Intramuscular Given 10/15/16 2255)  acetaminophen (TYLENOL) tablet 650 mg (650 mg Oral Given 10/15/16 2255)     Initial Impression / Assessment and Plan / ED Course  I have reviewed the triage vital signs and the nursing notes.  Pertinent labs & imaging results that were available during my care of the patient were reviewed by me and considered in my medical decision making (see chart for details).  Clinical Course   This is a 30 y.o. female who presents to the Emergency Department complaining of worsening URI symptoms x 1 week. Pt was seen at [facility] 2 days ago and was diagnosed with a URI. Pt complains of associated cough, congestion, post nasal drip, sore throat and pain with swallowing. No trouble swallowing. Pt states that this morning she woke up with a more severe sore throat, and notes that she is loosing her voice.  On examination his afebrile nontoxic appearing. She has mild bilateral tonsillar hypertrophy without exudates. Uvula is midline without edema. No peritonsillar abscess. No trismus. No drooling. Some slight hoarse voice. Mild middle ear effusion bilaterally. Rapid strep is negative. Patient received Decadron. She reports feeling better recheck after Decadron. I discussed that rapid strep is negative. Suspect laryngitis versus upper respiratory infection. Will start on Flonase, Zyrtec and naproxen. I discussed return precautions. I advised the patient to follow-up with their primary care provider this week. I advised the patient to return to the emergency department with new or worsening symptoms or new concerns. The patient verbalized understanding and agreement with plan.      Final Clinical Impressions(s) / ED Diagnoses   Final diagnoses:  Viral upper respiratory tract infection  Laryngitis    New Prescriptions Discharge Medication List as of 10/15/2016 11:31 PM    START taking these medications   Details  cetirizine (ZYRTEC ALLERGY) 10 MG tablet Take 1 tablet (10 mg  total) by mouth daily., Starting Thu 10/15/2016, Print    fluticasone (FLONASE) 50 MCG/ACT nasal spray Place 2 sprays into both nostrils daily., Starting Thu 10/15/2016, Print    naproxen (NAPROSYN) 500 MG tablet Take 1 tablet (500 mg total) by mouth 2 (two) times daily with a meal., Starting Thu 10/15/2016, Print      I personally performed the services described in this documentation, which was scribed in my presence. The recorded information has been reviewed and is accurate.       Everlene FarrierWilliam Thelbert Gartin, PA-C 10/16/16 [id number]    Doug SouSam Jacubowitz, MD 10/17/16 [phone]

## 2016-10-15 NOTE — ED Triage Notes (Signed)
[facility] ago and diagnosed with URI. She was given Bromfed DM and benzonatate for the cough. Now she has a sore throat.

## 2016-10-16 MED FILL — ALL DAY ALLERGY 10 MG TAB: 10 | 100 days supply | Qty: 100 | Fill #0

## 2016-10-16 MED FILL — SM ALLERGY RELIEF 50 MCG SP: 50 MCG | 30 days supply | Qty: 16 | Fill #0

## 2016-10-16 MED FILL — NAPROXEN 500 MG TABLET: 500 | 15 days supply | Qty: 30 | Fill #0

## 2016-10-18 LAB — CULTURE, GROUP A STREP (THRC)

## 2016-10-26 DIAGNOSIS — K219 Gastro-esophageal reflux disease without esophagitis: Secondary | ICD-10-CM | POA: Insufficient documentation

## 2017-10-13 ENCOUNTER — Emergency Department (HOSPITAL_BASED_OUTPATIENT_CLINIC_OR_DEPARTMENT_OTHER)
Admission: EM | Admit: 2017-10-13 | Discharge: 2017-10-13 | Disposition: A | Discharge status: Home / Self Care | Payer: Managed Care, Other (non HMO) | Attending: Emergency Medicine | Admitting: Emergency Medicine

## 2017-10-13 ENCOUNTER — Encounter (HOSPITAL_BASED_OUTPATIENT_CLINIC_OR_DEPARTMENT_OTHER): Payer: Self-pay

## 2017-10-13 DIAGNOSIS — Z91040 Latex allergy status: Secondary | ICD-10-CM | POA: Insufficient documentation

## 2017-10-13 DIAGNOSIS — Z711 Person with feared health complaint in whom no diagnosis is made: Secondary | ICD-10-CM

## 2017-10-13 DIAGNOSIS — B9689 Other specified bacterial agents as the cause of diseases classified elsewhere: Secondary | ICD-10-CM

## 2017-10-13 DIAGNOSIS — N760 Acute vaginitis: Secondary | ICD-10-CM | POA: Insufficient documentation

## 2017-10-13 LAB — URINALYSIS, ROUTINE W REFLEX MICROSCOPIC
BILIRUBIN URINE: NEGATIVE
Glucose, UA: NEGATIVE mg/dL
Ketones, ur: NEGATIVE mg/dL
NITRITE: NEGATIVE
Protein, ur: NEGATIVE mg/dL
SPECIFIC GRAVITY, URINE: 1.025 (ref 1.005–1.030)
pH: 6 (ref 5.0–8.0)

## 2017-10-13 LAB — WET PREP, GENITAL
SPERM: NONE SEEN
Trich, Wet Prep: NONE SEEN
YEAST WET PREP: NONE SEEN

## 2017-10-13 LAB — URINALYSIS, MICROSCOPIC (REFLEX)

## 2017-10-13 LAB — PREGNANCY, URINE: PREG TEST UR: NEGATIVE

## 2017-10-13 MED ORDER — METRONIDAZOLE 500 MG PO TABS: 500.0000 mg | ORAL_TABLET | Freq: Two times a day (BID) | ORAL | 0 refills | Status: DC

## 2017-10-13 MED ORDER — DOXYCYCLINE HYCLATE 100 MG PO CAPS: 100.0000 mg | ORAL_CAPSULE | Freq: Two times a day (BID) | ORAL | 0 refills | Status: DC

## 2017-10-13 MED ORDER — CEFTRIAXONE SODIUM 250 MG IJ SOLR
250.0000 mg | Freq: Once | INTRAMUSCULAR | Status: AC
  Administered 2017-10-13: 250 mg via INTRAMUSCULAR
  Filled 2017-10-13: qty 250

## 2017-10-13 NOTE — ED Triage Notes (Signed)
C/o vaginal d/c x 1 week-sexual partner + STD-NAD-steady gait

## 2017-10-13 NOTE — ED Provider Notes (Signed)
[facility] EMERGENCY DEPARTMENT Provider Note   CSN: [id number] Arrival date & time: 10/13/17  1820     History   Chief Complaint Chief Complaint  Patient presents with  . Vaginal Discharge    HPI Katelyn Larson is a 31 y.o. female.  Katelyn Larson is a 31 y.o. Female who presents to the emergency department complaining of vaginal discharge and concern for STD today.  Patient reports her most recent sexual partner recently tested positive for chlamydia.  She reports over the past week she noticed some slight vaginal discharge.  She like to be tested for STDs.  She is sexually active and does not use protection.  She is on Mirena for birth control.  Last menstrual cycle was in April due to her Mirena IUD.  She denies fevers, abdominal pain, nausea, vomiting, diarrhea, vaginal bleeding, coughing, or rashes.   The history is provided by the patient and medical records. No language interpreter was used.  Vaginal Discharge   Pertinent negatives include no fever, no abdominal pain, no diarrhea, no nausea, no vomiting, no dysuria and no frequency.    Past Medical History:  Diagnosis Date  . Abnormal Pap smear   . HPV (human papilloma virus) anogenital infection     Patient Active Problem List   Diagnosis Date Noted  . NSVD (normal spontaneous vaginal delivery) 01/26/2013    Past Surgical History:  Procedure Laterality Date  . CHOLECYSTECTOMY      OB History    Gravida Para Term Preterm AB Living   2 2 2     2    SAB TAB Ectopic Multiple Live Births           2       Home Medications    Prior to Admission medications   Medication Sig Start Date End Date Taking? Authorizing Provider  doxycycline (VIBRAMYCIN) 100 MG capsule Take 1 capsule (100 mg total) by mouth 2 (two) times daily. 10/13/17   Everlene Farrieransie, Rosali Augello, PA-C  metroNIDAZOLE (FLAGYL) 500 MG tablet Take 1 tablet (500 mg total) by mouth 2 (two) times daily. 10/13/17   Everlene Farrieransie, Devario Bucklew, PA-C     Family History Family History  Problem Relation Age of Onset  . Cancer Maternal Aunt        breast  . Diabetes Maternal Aunt   . Heart disease Maternal Aunt   . Diabetes Maternal Uncle   . Heart disease Maternal Uncle   . Cancer Paternal Aunt        breast  . Diabetes Paternal Aunt   . Heart disease Paternal Aunt   . Diabetes Paternal Uncle   . Heart disease Paternal Uncle   . Cancer Paternal Grandmother        breast    Social History Social History  Substance Use Topics  . Smoking status: Never Smoker  . Smokeless tobacco: Never Used  . Alcohol use Yes     Comment: occ     Allergies   Latex and Tramadol   Review of Systems Review of Systems  Constitutional: Negative for chills and fever.  HENT: Negative for mouth sores.   Eyes: Negative for visual disturbance.  Respiratory: Negative for cough and shortness of breath.   Cardiovascular: Negative for chest pain.  Gastrointestinal: Negative for abdominal pain, diarrhea, nausea and vomiting.  Genitourinary: Positive for vaginal discharge. Negative for difficulty urinating, dysuria, frequency, menstrual problem, pelvic pain, urgency, vaginal bleeding and vaginal pain.  Musculoskeletal: Negative for back pain.  Skin: Negative for rash.  Neurological: Negative for headaches.     Physical Exam Updated Vital Signs BP (!) 131/91 (BP Location: Left Arm)   Pulse 97   Temp 98.1 F (36.7 C) (Oral)   Resp 18   Ht 5\' 3"  (1.6 m)   Wt 96.8 kg (213 lb 6.5 oz)   SpO2 96%   BMI 37.80 kg/m   Physical Exam  Constitutional: She appears well-developed and well-nourished. No distress.  HENT:  Head: Normocephalic and atraumatic.  Eyes: Conjunctivae are normal. Right eye exhibits no discharge. Left eye exhibits no discharge.  Neck: Neck supple.  Cardiovascular: Normal rate, regular rhythm, normal heart sounds and intact distal pulses.   Pulmonary/Chest: Effort normal and breath sounds normal. No respiratory distress.   Abdominal: Soft. She exhibits no distension. There is no tenderness. There is no guarding.  Genitourinary: Vaginal discharge found.  Genitourinary Comments: Pelvic exam with female nurse tech as chaperone.  No external lesions or rashes noted.  Mild amount of white vaginal discharge noted.  Cervix is closed.  No cervical motion tenderness.  No adnexal tenderness or fullness.  Musculoskeletal: She exhibits no edema.  Lymphadenopathy:    She has no cervical adenopathy.  Neurological: She is alert. Coordination normal.  Skin: Skin is warm and dry. No rash noted. She is not diaphoretic.  Psychiatric: She has a normal mood and affect. Her behavior is normal.  Nursing note and vitals reviewed.    ED Treatments / Results  Labs (all labs ordered are listed, but only abnormal results are displayed) Labs Reviewed  WET PREP, GENITAL - Abnormal; Notable for the following:       Result Value   Clue Cells Wet Prep HPF POC PRESENT (*)    WBC, Wet Prep HPF POC MODERATE (*)    All other components within normal limits  URINALYSIS, ROUTINE W REFLEX MICROSCOPIC - Abnormal; Notable for the following:    APPearance CLOUDY (*)    Hgb urine dipstick LARGE (*)    Leukocytes, UA SMALL (*)    All other components within normal limits  URINALYSIS, MICROSCOPIC (REFLEX) - Abnormal; Notable for the following:    Bacteria, UA FEW (*)    Squamous Epithelial / LPF 6-30 (*)    All other components within normal limits  PREGNANCY, URINE  RPR  HIV ANTIBODY (ROUTINE TESTING)  GC/CHLAMYDIA PROBE AMP ([place]) NOT AT [facility]    EKG  EKG Interpretation None       Radiology No results found.  Procedures Procedures (including critical care time)  Medications Ordered in ED Medications  cefTRIAXone (ROCEPHIN) injection 250 mg (not administered)     Initial Impression / Assessment and Plan / ED Course  I have reviewed the triage vital signs and the nursing notes.  Pertinent labs & imaging results  that were available during my care of the patient were reviewed by me and considered in my medical decision making (see chart for details).    This  is a 31 y.o. Female who presents to the emergency department complaining of vaginal discharge and concern for STD today.  Patient reports her most recent sexual partner recently tested positive for chlamydia.  She reports over the past week she noticed some slight vaginal discharge.  She like to be tested for STDs.  She is sexually active and does not use protection.  She is on Mirena for birth control.  Last menstrual cycle was in April due to her Mirena IUD.  She denies  fevers, abdominal pain, nausea, vomiting, diarrhea, vaginal bleeding or urinary symptoms.  On exam the patient is afebrile and nontoxic-appearing.  Her abdomen is soft and nontender to palpation.  On pelvic exam she has a mild amount of white vaginal discharge.  No cervical motion tenderness.  No adnexal tenderness or fullness.  No vaginal bleeding. Pregnancy test is negative.  Wet prep shows clue cells and moderate white blood cells. At reevaluation patient reports she is concerned about taking azithromycin due to possible allergic reaction previously.  She is unsure to the specifics.  In any case we will cover for chlamydia with a course of doxycycline instead.  We will also provide her with IM Rocephin here in the emergency department to cover for gonorrhea.  With her bacterial vaginosis we will also send him with a course of Flagyl.  I discussed safe sex practices.  I encouraged her to follow-up on her test results.  I also encouraged her to follow-up with the health department for routine STD examinations. I advised the patient to follow-up with their primary care provider this week. I advised the patient to return to the emergency department with new or worsening symptoms or new concerns. The patient verbalized understanding and agreement with plan.      Final Clinical Impressions(s) /  ED Diagnoses   Final diagnoses:  BV (bacterial vaginosis)  Concern about STD in female without diagnosis    New Prescriptions New Prescriptions   DOXYCYCLINE (VIBRAMYCIN) 100 MG CAPSULE    Take 1 capsule (100 mg total) by mouth 2 (two) times daily.   METRONIDAZOLE (FLAGYL) 500 MG TABLET    Take 1 tablet (500 mg total) by mouth 2 (two) times daily.     Everlene Farrier, PA-C 10/13/17 1945    Pricilla Loveless, MD 10/13/17 [phone]

## 2017-10-14 LAB — GC/CHLAMYDIA PROBE AMP (~~LOC~~) NOT AT ARMC
Chlamydia: POSITIVE — AB
Neisseria Gonorrhea: NEGATIVE

## 2017-10-15 LAB — RPR: RPR Ser Ql: NONREACTIVE

## 2017-10-15 LAB — HIV ANTIBODY (ROUTINE TESTING W REFLEX): HIV Screen 4th Generation wRfx: NONREACTIVE

## 2018-11-10 ENCOUNTER — Emergency Department (HOSPITAL_BASED_OUTPATIENT_CLINIC_OR_DEPARTMENT_OTHER)
Admission: EM | Admit: 2018-11-10 | Discharge: 2018-11-11 | Disposition: A | Discharge status: Home / Self Care | Attending: Emergency Medicine | Admitting: Emergency Medicine

## 2018-11-10 ENCOUNTER — Encounter (HOSPITAL_BASED_OUTPATIENT_CLINIC_OR_DEPARTMENT_OTHER): Payer: Self-pay | Admitting: *Deleted

## 2018-11-10 ENCOUNTER — Emergency Department (HOSPITAL_BASED_OUTPATIENT_CLINIC_OR_DEPARTMENT_OTHER)

## 2018-11-10 ENCOUNTER — Other Ambulatory Visit: Payer: Self-pay

## 2018-11-10 DIAGNOSIS — W208XXA Other cause of strike by thrown, projected or falling object, initial encounter: Secondary | ICD-10-CM | POA: Diagnosis not present

## 2018-11-10 DIAGNOSIS — S6992XA Unspecified injury of left wrist, hand and finger(s), initial encounter: Secondary | ICD-10-CM | POA: Diagnosis present

## 2018-11-10 DIAGNOSIS — Z91040 Latex allergy status: Secondary | ICD-10-CM | POA: Diagnosis not present

## 2018-11-10 DIAGNOSIS — Y990 Civilian activity done for income or pay: Secondary | ICD-10-CM | POA: Insufficient documentation

## 2018-11-10 DIAGNOSIS — Y92818 Other transport vehicle as the place of occurrence of the external cause: Secondary | ICD-10-CM | POA: Insufficient documentation

## 2018-11-10 DIAGNOSIS — S60222A Contusion of left hand, initial encounter: Secondary | ICD-10-CM | POA: Diagnosis not present

## 2018-11-10 DIAGNOSIS — Y9389 Activity, other specified: Secondary | ICD-10-CM | POA: Diagnosis not present

## 2018-11-10 DIAGNOSIS — S60212A Contusion of left wrist, initial encounter: Secondary | ICD-10-CM | POA: Diagnosis not present

## 2018-11-10 NOTE — ED Provider Notes (Signed)
[facility] EMERGENCY DEPARTMENT Provider Note   CSN: [id number] Arrival date & time: 11/10/18  2154     History   Chief Complaint Chief Complaint  Patient presents with  . Wrist Injury    HPI Katelyn Larson is a 32 y.o. female.  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 She was unloading a truck when a package fell, trapping her left wrist and hand between the box and a metal support structure. She is complaining of pain to the left wrist and hand.  The history is provided by the patient. No language interpreter was used.  Wrist Injury   The incident occurred at work. The injury mechanism was a direct blow and compression. The pain is present in the left wrist and left hand. The quality of the pain is described as throbbing. The pain is moderate. The pain has been intermittent since the incident. She reports no foreign bodies present. She has tried rest for the symptoms. The treatment provided no relief.    Past Medical History:  Diagnosis Date  . Abnormal Pap smear   . HPV (human papilloma virus) anogenital infection     Patient Active Problem List   Diagnosis Date Noted  . NSVD (normal spontaneous vaginal delivery) 01/26/2013    Past Surgical History:  Procedure Laterality Date  . CHOLECYSTECTOMY       OB History    Gravida  2   Para  2   Term  2   Preterm      AB      Living  2     SAB      TAB      Ectopic      Multiple      Live Births  2            Home Medications    Prior to Admission medications   Medication Sig Start Date End Date Taking? Authorizing Provider  doxycycline (VIBRAMYCIN) 100 MG capsule Take 1 capsule (100 mg total) by mouth 2 (two) times daily. 10/13/17   Everlene Farrieransie, William, PA-C  metroNIDAZOLE (FLAGYL) 500 MG tablet Take 1 tablet (500 mg total) by mouth 2 (two) times daily. 10/13/17   Everlene Farrieransie, William, PA-C    Family History Family History  Problem Relation Age of Onset  . Cancer Maternal Aunt        breast    . Diabetes Maternal Aunt   . Heart disease Maternal Aunt   . Diabetes Maternal Uncle   . Heart disease Maternal Uncle   . Cancer Paternal Aunt        breast  . Diabetes Paternal Aunt   . Heart disease Paternal Aunt   . Diabetes Paternal Uncle   . Heart disease Paternal Uncle   . Cancer Paternal Grandmother        breast    Social History Social History   Tobacco Use  . Smoking status: Never Smoker  . Smokeless tobacco: Never Used  Substance Use Topics  . Alcohol use: Yes    Comment: occ  . Drug use: No     Allergies   Latex and Tramadol   Review of Systems Review of Systems  Musculoskeletal: Positive for arthralgias.  All other systems reviewed and are negative.    Physical Exam Updated Vital Signs BP 122/80   Pulse 81   Temp 98.3 F (36.8 C) (Oral)   Resp 20   Ht 5\' 3"  (1.6 m)   Wt 96.8 kg   LMP  11/09/2018   SpO2 100%   BMI 37.80 kg/m   Physical Exam  Constitutional: She is oriented to person, place, and time. She appears well-developed and well-nourished.  HENT:  Head: Atraumatic.  Eyes: Conjunctivae are normal.  Neck: Neck supple.  Cardiovascular: Normal rate and regular rhythm.  Pulmonary/Chest: Breath sounds normal.  Abdominal: Soft. Bowel sounds are normal.  Musculoskeletal: Normal range of motion. She exhibits tenderness. She exhibits no deformity.       Left wrist: She exhibits tenderness and swelling. She exhibits normal range of motion and no deformity.  Tenderness to anterior left wrist and dorsum of hand.  Neurological: She is alert and oriented to person, place, and time. No sensory deficit.  Skin: Skin is warm and dry.  Psychiatric: She has a normal mood and affect.  Nursing note and vitals reviewed.    ED Treatments / Results  Labs (all labs ordered are listed, but only abnormal results are displayed) Labs Reviewed - No data to display  EKG None  Radiology Dg Wrist Complete Left  Result Date: 11/10/2018 CLINICAL DATA:   Left hand and wrist pain after injury, dropped a box on her arm at work tonight. EXAM: LEFT WRIST - COMPLETE 3+ VIEW COMPARISON:  None. FINDINGS: There is no evidence of fracture or dislocation. There is no evidence of arthropathy or other focal bone abnormality. Soft tissues are unremarkable. IMPRESSION: Negative radiographs of the left wrist. Electronically Signed   By: Narda Rutherford M.D.   On: 11/10/2018 22:29   Dg Hand Complete Left  Result Date: 11/10/2018 CLINICAL DATA:  Left hand and wrist pain after injury, dropped a box on her arm at work tonight. EXAM: LEFT HAND - COMPLETE 3+ VIEW COMPARISON:  None. FINDINGS: Evaluation of the digits on the lateral view is limited due to osseous overlap. There is no evidence of fracture or dislocation. There is no evidence of arthropathy or other focal bone abnormality. Soft tissues are unremarkable. IMPRESSION: Negative radiographs of the left hand. Electronically Signed   By: Narda Rutherford M.D.   On: 11/10/2018 22:28    Procedures Procedures (including critical care time)  Medications Ordered in ED Medications - No data to display   Initial Impression / Assessment and Plan / ED Course  I have reviewed the triage vital signs and the nursing notes.  Pertinent labs & imaging results that were available during my care of the patient were reviewed by me and considered in my medical decision making (see chart for details).     Patient X-Ray negative for obvious fracture or dislocation.  Pt advised to follow up with orthopedics. Patient given wrist splint while in ED, conservative therapy recommended and discussed. Patient will be discharged home & is agreeable with above plan. Returns precautions discussed. Pt appears safe for discharge.  Final Clinical Impressions(s) / ED Diagnoses   Final diagnoses:  Contusion of multiple sites of left hand and wrist, initial encounter    ED Discharge Orders    None       Felicie Morn, NP 11/11/18  Lorin Picket    Shaune Pollack, MD 11/11/18 [phone]

## 2018-11-10 NOTE — ED Triage Notes (Signed)
She works at the [employer]. A box fell on her arm tonight. Pain in her left wrist and hand.

## 2018-11-11 NOTE — Discharge Instructions (Addendum)
Wear splint as directed. Elevate hand/wrist. Apply ice to affected area for 20-30 minutes 3-4 times per day for the next 3 days. Limit use of left hand/wrist until pain and swelling have improved. Follow-up with your company's workers compensation provider to determine return to duty status.

## 2018-11-17 ENCOUNTER — Ambulatory Visit (INDEPENDENT_AMBULATORY_CARE_PROVIDER_SITE_OTHER): Admitting: Family Medicine

## 2018-11-17 ENCOUNTER — Encounter: Payer: Self-pay | Admitting: Family Medicine

## 2018-11-17 VITALS — BP 118/79 | HR 76 | Ht 63.0 in | Wt 193.0 lb

## 2018-11-17 DIAGNOSIS — S6722XA Crushing injury of left hand, initial encounter: Secondary | ICD-10-CM | POA: Diagnosis not present

## 2018-11-17 NOTE — Progress Notes (Signed)
PCP: Patient, No Pcp Per  Subjective:   HPI: Patient is a 32 y.o. female here for left hand injury.  Patient reports on 11/28 at work she had a 42 pound package fall crushing her left hand between the package and a metal support structure. Immediate pain, throbbing that has persisted. Better with rest but sharp. She reported pain at 0/10 within the wrist brace. Some associated numbness in dorsal wrist to distal metacarpal area. No skin changes but some numbness. No prior injuries.  Past Medical History:  Diagnosis Date  . Abnormal Pap smear   . HPV (human papilloma virus) anogenital infection     No current outpatient medications on file prior to visit.   No current facility-administered medications on file prior to visit.     Past Surgical History:  Procedure Laterality Date  . CHOLECYSTECTOMY      Allergies  Allergen Reactions  . Latex Swelling    reddness ans itching  . Tramadol Nausea And Vomiting    Social History   Socioeconomic History  . Marital status: Single    Spouse name: Not on file  . Number of children: Not on file  . Years of education: Not on file  . Highest education level: Not on file  Occupational History  . Not on file  Social Needs  . Financial resource strain: Not on file  . Food insecurity:    Worry: Not on file    Inability: Not on file  . Transportation needs:    Medical: Not on file    Non-medical: Not on file  Tobacco Use  . Smoking status: Never Smoker  . Smokeless tobacco: Never Used  Substance and Sexual Activity  . Alcohol use: Yes    Comment: occ  . Drug use: No  . Sexual activity: Yes    Birth control/protection: IUD  Lifestyle  . Physical activity:    Days per week: Not on file    Minutes per session: Not on file  . Stress: Not on file  Relationships  . Social connections:    Talks on phone: Not on file    Gets together: Not on file    Attends religious service: Not on file    Active member of club or  organization: Not on file    Attends meetings of clubs or organizations: Not on file    Relationship status: Not on file  . Intimate partner violence:    Fear of current or ex partner: Not on file    Emotionally abused: Not on file    Physically abused: Not on file    Forced sexual activity: Not on file  Other Topics Concern  . Not on file  Social History Narrative  . Not on file    Family History  Problem Relation Age of Onset  . Cancer Maternal Aunt        breast  . Diabetes Maternal Aunt   . Heart disease Maternal Aunt   . Diabetes Maternal Uncle   . Heart disease Maternal Uncle   . Cancer Paternal Aunt        breast  . Diabetes Paternal Aunt   . Heart disease Paternal Aunt   . Diabetes Paternal Uncle   . Heart disease Paternal Uncle   . Cancer Paternal Grandmother        breast    BP 118/79   Pulse 76   Ht 5\' 3"  (1.6 m)   Wt 193 lb (87.5 kg)   LMP  11/09/2018   BMI 34.19 kg/m   Review of Systems: See HPI above.     Objective:  Physical Exam:  Gen: NAD, comfortable in exam room  Left hand/wrist: No deformity, malrotation or angulation of digits.  No swelling, bruising. FROM digits with 5/5 strength.  Wrist motion very limited however. TTP diffusely from distal radius and ulna through all metacarpals dorsally. NVI distally.  Right hand/wrist: No deformity. FROM with 5/5 strength. No tenderness to palpation. NVI distally.   Left elbow: FROM without pain and no tenderness.  MSK u/s left hand:  No cortical irregularities of metacarpals, carpal bones, radius, ulna. Assessment & Plan:  1. Left hand injury - crush injury.  Independently reviewed radiographs and no evidence fracture.  Ultrasound reassuring as well.  2/2 contusion.  Icing, aleve or ibuprofen. Wrist brace for support.  Limit use of left hand - letter written.  F/u in 2 weeks.

## 2018-11-17 NOTE — Patient Instructions (Signed)
You have a severe contusion of your left hand. Ice the area 15 minutes at a time 3-4 times a day. Aleve 2 tabs twice a day with food OR ibuprofen 600mg  three times a day with food regularly for pain and inflammation. Wear wrist brace regularly. Follow up with me in 2 weeks for reevaluation.

## 2018-12-01 ENCOUNTER — Ambulatory Visit (INDEPENDENT_AMBULATORY_CARE_PROVIDER_SITE_OTHER): Admitting: Family Medicine

## 2018-12-01 ENCOUNTER — Ambulatory Visit (HOSPITAL_BASED_OUTPATIENT_CLINIC_OR_DEPARTMENT_OTHER)
Admission: RE | Admit: 2018-12-01 | Discharge: 2018-12-01 | Disposition: A | Discharge status: Home / Self Care | Source: Ambulatory Visit | Attending: Family Medicine | Admitting: Family Medicine

## 2018-12-01 ENCOUNTER — Encounter: Payer: Self-pay | Admitting: Family Medicine

## 2018-12-01 VITALS — BP 126/84 | HR 63 | Ht 63.0 in | Wt 192.0 lb

## 2018-12-01 DIAGNOSIS — S6992XA Unspecified injury of left wrist, hand and finger(s), initial encounter: Secondary | ICD-10-CM

## 2018-12-01 DIAGNOSIS — S6992XD Unspecified injury of left wrist, hand and finger(s), subsequent encounter: Secondary | ICD-10-CM

## 2018-12-01 MED ORDER — DICLOFENAC SODIUM 75 MG PO TBEC
75.0000 mg | DELAYED_RELEASE_TABLET | Freq: Two times a day (BID) | ORAL | 1 refills | Status: DC
Start: 2018-12-01 — End: 2019-01-30

## 2018-12-01 NOTE — Patient Instructions (Addendum)
Since you're not improving as expected we will go ahead with an MRI of your wrist to assess for an occult scaphoid fracture. Ice the area 15 minutes at a time 3-4 times a day. Diclofenac twice a day with food for pain and inflammation. Wear wrist brace regularly. Follow up with me in 4 weeks tentatively though that may change depending on the MRI.

## 2018-12-01 NOTE — Progress Notes (Signed)
PCP: Patient, No Pcp Per  Subjective:   HPI: Patient is a 32 y.o. female here for left hand injury.  12/5: Patient reports on 11/28 at work she had a 42 pound package fall crushing her left hand between the package and a metal support structure. Immediate pain, throbbing that has persisted. Better with rest but sharp. She reported pain at 0/10 within the wrist brace. Some associated numbness in dorsal wrist to distal metacarpal area. No skin changes but some numbness. No prior injuries.  12/19: Patient reports she feels the same. Wearing wrist brace, taking ibuprofen. Pain level 8/10 and sharp dorsal wrist. Worse with any up/down movement and hard to hold onto something. Worse with cold weather. No skin changes.  Past Medical History:  Diagnosis Date  . Abnormal Pap smear   . HPV (human papilloma virus) anogenital infection     No current outpatient medications on file prior to visit.   No current facility-administered medications on file prior to visit.     Past Surgical History:  Procedure Laterality Date  . CHOLECYSTECTOMY      Allergies  Allergen Reactions  . Latex Swelling    reddness ans itching  . Tramadol Nausea And Vomiting    Social History   Socioeconomic History  . Marital status: Single    Spouse name: Not on file  . Number of children: Not on file  . Years of education: Not on file  . Highest education level: Not on file  Occupational History  . Not on file  Social Needs  . Financial resource strain: Not on file  . Food insecurity:    Worry: Not on file    Inability: Not on file  . Transportation needs:    Medical: Not on file    Non-medical: Not on file  Tobacco Use  . Smoking status: Never Smoker  . Smokeless tobacco: Never Used  Substance and Sexual Activity  . Alcohol use: Yes    Comment: occ  . Drug use: No  . Sexual activity: Yes    Birth control/protection: I.U.D.  Lifestyle  . Physical activity:    Days per week: Not on  file    Minutes per session: Not on file  . Stress: Not on file  Relationships  . Social connections:    Talks on phone: Not on file    Gets together: Not on file    Attends religious service: Not on file    Active member of club or organization: Not on file    Attends meetings of clubs or organizations: Not on file    Relationship status: Not on file  . Intimate partner violence:    Fear of current or ex partner: Not on file    Emotionally abused: Not on file    Physically abused: Not on file    Forced sexual activity: Not on file  Other Topics Concern  . Not on file  Social History Narrative  . Not on file    Family History  Problem Relation Age of Onset  . Cancer Maternal Aunt        breast  . Diabetes Maternal Aunt   . Heart disease Maternal Aunt   . Diabetes Maternal Uncle   . Heart disease Maternal Uncle   . Cancer Paternal Aunt        breast  . Diabetes Paternal Aunt   . Heart disease Paternal Aunt   . Diabetes Paternal Uncle   . Heart disease Paternal Uncle   .  Cancer Paternal Grandmother        breast    BP 126/84   Pulse 63   Ht 5\' 3"  (1.6 m)   Wt 192 lb (87.1 kg)   LMP 11/09/2018   BMI 34.01 kg/m   Review of Systems: See HPI above.     Objective:  Physical Exam:  Gen: NAD, comfortable in exam room  Left hand/wrist: No deformity, swelling, bruising.  No malrotation or angulation of digits. FROM digits with 5/5 strength.   Wrist motion still very limited TTP from distal radius and ulna through base of all metacarpals including over scaphoid. NVI distally.  Assessment & Plan:  1. Left hand injury - 2/2 crush injury.  Independently reviewed repeat radiographs and no evidence fracture.  Concerning with mechanism of injury, severity of pain and location of pain for possible occult fracture specifically of scaphoid.  Will go ahead with MRI to further assess.  In meantime continue brace, icing, diclofenac.  F/u in 4 weeks tentatively.  Work note  filled out.

## 2018-12-28 ENCOUNTER — Ambulatory Visit (INDEPENDENT_AMBULATORY_CARE_PROVIDER_SITE_OTHER): Admitting: Family Medicine

## 2018-12-28 ENCOUNTER — Ambulatory Visit (HOSPITAL_BASED_OUTPATIENT_CLINIC_OR_DEPARTMENT_OTHER)
Admission: RE | Admit: 2018-12-28 | Discharge: 2018-12-28 | Disposition: A | Discharge status: Home / Self Care | Source: Ambulatory Visit | Attending: Family Medicine | Admitting: Family Medicine

## 2018-12-28 ENCOUNTER — Encounter: Payer: Self-pay | Admitting: Family Medicine

## 2018-12-28 VITALS — BP 146/95 | HR 84 | Ht 63.0 in | Wt 192.0 lb

## 2018-12-28 DIAGNOSIS — S6992XD Unspecified injury of left wrist, hand and finger(s), subsequent encounter: Secondary | ICD-10-CM | POA: Diagnosis not present

## 2018-12-28 DIAGNOSIS — S6732XD Crushing injury of left wrist, subsequent encounter: Secondary | ICD-10-CM

## 2018-12-28 NOTE — Progress Notes (Signed)
PCP: Patient, No Pcp Per  Subjective:   HPI: Patient is a 33 y.o. female here for left hand injury.  12/5: Patient reports on 11/28 at work she had a 42 pound package fall crushing her left hand between the package and a metal support structure. Immediate pain, throbbing that has persisted. Better with rest but sharp. She reported pain at 0/10 within the wrist brace. Some associated numbness in dorsal wrist to distal metacarpal area. No skin changes but some numbness. No prior injuries.  12/19: Patient reports she feels the same. Wearing wrist brace, taking ibuprofen. Pain level 8/10 and sharp dorsal wrist. Worse with any up/down movement and hard to hold onto something. Worse with cold weather. No skin changes.  12/28/18: Patient reports she's doing only mildly better compared to last visit. Still wearing wrist brace, trying voltaren. Pain level up to 10/10 and sharp especially at nighttime. Still with swelling. No skin changes.  Past Medical History:  Diagnosis Date  . Abnormal Pap smear   . HPV (human papilloma virus) anogenital infection     Current Outpatient Medications on File Prior to Visit  Medication Sig Dispense Refill  . diclofenac (VOLTAREN) 75 MG EC tablet Take 1 tablet (75 mg total) by mouth 2 (two) times daily. 60 tablet 1   No current facility-administered medications on file prior to visit.     Past Surgical History:  Procedure Laterality Date  . CHOLECYSTECTOMY      Allergies  Allergen Reactions  . Latex Swelling    reddness ans itching  . Tramadol Nausea And Vomiting    Social History   Socioeconomic History  . Marital status: Single    Spouse name: Not on file  . Number of children: Not on file  . Years of education: Not on file  . Highest education level: Not on file  Occupational History  . Not on file  Social Needs  . Financial resource strain: Not on file  . Food insecurity:    Worry: Not on file    Inability: Not on file   . Transportation needs:    Medical: Not on file    Non-medical: Not on file  Tobacco Use  . Smoking status: Never Smoker  . Smokeless tobacco: Never Used  Substance and Sexual Activity  . Alcohol use: Yes    Comment: occ  . Drug use: No  . Sexual activity: Yes    Birth control/protection: I.U.D.  Lifestyle  . Physical activity:    Days per week: Not on file    Minutes per session: Not on file  . Stress: Not on file  Relationships  . Social connections:    Talks on phone: Not on file    Gets together: Not on file    Attends religious service: Not on file    Active member of club or organization: Not on file    Attends meetings of clubs or organizations: Not on file    Relationship status: Not on file  . Intimate partner violence:    Fear of current or ex partner: Not on file    Emotionally abused: Not on file    Physically abused: Not on file    Forced sexual activity: Not on file  Other Topics Concern  . Not on file  Social History Narrative  . Not on file    Family History  Problem Relation Age of Onset  . Cancer Maternal Aunt        breast  . Diabetes  Maternal Aunt   . Heart disease Maternal Aunt   . Diabetes Maternal Uncle   . Heart disease Maternal Uncle   . Cancer Paternal Aunt        breast  . Diabetes Paternal Aunt   . Heart disease Paternal Aunt   . Diabetes Paternal Uncle   . Heart disease Paternal Uncle   . Cancer Paternal Grandmother        breast    BP (!) 146/95   Pulse 84   Ht 5\' 3"  (1.6 m)   Wt 192 lb (87.1 kg)   BMI 34.01 kg/m   Review of Systems: See HPI above.     Objective:  Physical Exam:  Gen: NAD, comfortable in exam room  Left hand/wrist: No deformity, swelling, bruising, malrotation or angulation. FROM digits with 5/5 strength.  Wrist motion limited to 10 degrees flexion and extension, painful. TTP wrist joint mid-carpal bones and at snuffbox. Pain with axial loading of thumb. NVI distally.  Assessment & Plan:  1.  Left hand injury - 2/2 crush injury.  Independently reviewed repeat radiographs and no evidence fracture though we discussed many scaphoid fractures are not visible on radiographs.  Unfortunately her MRI has not yet been approved - we will resubmit this and try to get done asap.  Continue brace, diclofenac, icing in meantime.  Continue current restrictions.

## 2018-12-28 NOTE — Patient Instructions (Addendum)
Get x-rays as you leave today - we will call you with the results. Assuming these look ok we will check on the MRI again, resubmit the order for this. Ice the area 15 minutes at a time 3-4 times a day. Diclofenac twice a day with food for pain and inflammation. Wear wrist brace regularly.

## 2019-01-14 ENCOUNTER — Ambulatory Visit (HOSPITAL_BASED_OUTPATIENT_CLINIC_OR_DEPARTMENT_OTHER)
Admission: RE | Admit: 2019-01-14 | Discharge: 2019-01-14 | Disposition: A | Discharge status: Home / Self Care | Source: Ambulatory Visit | Attending: Family Medicine | Admitting: Family Medicine

## 2019-01-14 DIAGNOSIS — S6992XD Unspecified injury of left wrist, hand and finger(s), subsequent encounter: Secondary | ICD-10-CM | POA: Insufficient documentation

## 2019-01-17 NOTE — Addendum Note (Signed)
Addended by: Kathi Simpers F on: 01/17/2019 04:08 PM   Modules accepted: Orders

## 2019-01-29 ENCOUNTER — Other Ambulatory Visit: Payer: Self-pay | Admitting: Family Medicine

## 2019-02-08 ENCOUNTER — Encounter: Payer: Self-pay | Admitting: Family Medicine

## 2019-02-08 ENCOUNTER — Ambulatory Visit (INDEPENDENT_AMBULATORY_CARE_PROVIDER_SITE_OTHER): Admitting: Family Medicine

## 2019-02-08 VITALS — BP 106/72 | HR 62 | Ht 63.0 in | Wt 190.0 lb

## 2019-02-08 DIAGNOSIS — S6722XD Crushing injury of left hand, subsequent encounter: Secondary | ICD-10-CM

## 2019-02-08 DIAGNOSIS — S6992XD Unspecified injury of left wrist, hand and finger(s), subsequent encounter: Secondary | ICD-10-CM

## 2019-02-08 NOTE — Patient Instructions (Signed)
Use the brace and take diclofenac as needed. Continue doing motion exercises of your wrist. Swelling is still to be expected and can take months to resolve. As you work out the stiffness pain should resolve completely. Follow up with me as needed.

## 2019-02-09 ENCOUNTER — Encounter: Payer: Self-pay | Admitting: Family Medicine

## 2019-02-09 NOTE — Progress Notes (Signed)
PCP: Patient, No Pcp Per  Subjective:   HPI: Patient is a 33 y.o. female here for left hand injury.  12/5: Patient reports on 11/28 at work she had a 42 pound package fall crushing her left hand between the package and a metal support structure. Immediate pain, throbbing that has persisted. Better with rest but sharp. She reported pain at 0/10 within the wrist brace. Some associated numbness in dorsal wrist to distal metacarpal area. No skin changes but some numbness. No prior injuries.  12/19: Patient reports she feels the same. Wearing wrist brace, taking ibuprofen. Pain level 8/10 and sharp dorsal wrist. Worse with any up/down movement and hard to hold onto something. Worse with cold weather. No skin changes.  12/28/18: Patient reports she's doing only mildly better compared to last visit. Still wearing wrist brace, trying voltaren. Pain level up to 10/10 and sharp especially at nighttime. Still with swelling. No skin changes.  2/26: Patient reports she is improving. Pain level up to 5 out of 10 and throbbing dorsally on the left hand. Motion is improved. Still gets swelling if she does a lot of activity. No skin changes.  Past Medical History:  Diagnosis Date  . Abnormal Pap smear   . HPV (human papilloma virus) anogenital infection     Current Outpatient Medications on File Prior to Visit  Medication Sig Dispense Refill  . diclofenac (VOLTAREN) 75 MG EC tablet TAKE 1 TABLET(75 MG) BY MOUTH TWICE DAILY 60 tablet 1   No current facility-administered medications on file prior to visit.     Past Surgical History:  Procedure Laterality Date  . CHOLECYSTECTOMY      Allergies  Allergen Reactions  . Latex Swelling    reddness ans itching  . Tramadol Nausea And Vomiting    Social History   Socioeconomic History  . Marital status: Single    Spouse name: Not on file  . Number of children: Not on file  . Years of education: Not on file  . Highest  education level: Not on file  Occupational History  . Not on file  Social Needs  . Financial resource strain: Not on file  . Food insecurity:    Worry: Not on file    Inability: Not on file  . Transportation needs:    Medical: Not on file    Non-medical: Not on file  Tobacco Use  . Smoking status: Never Smoker  . Smokeless tobacco: Never Used  Substance and Sexual Activity  . Alcohol use: Yes    Comment: occ  . Drug use: No  . Sexual activity: Yes    Birth control/protection: I.U.D.  Lifestyle  . Physical activity:    Days per week: Not on file    Minutes per session: Not on file  . Stress: Not on file  Relationships  . Social connections:    Talks on phone: Not on file    Gets together: Not on file    Attends religious service: Not on file    Active member of club or organization: Not on file    Attends meetings of clubs or organizations: Not on file    Relationship status: Not on file  . Intimate partner violence:    Fear of current or ex partner: Not on file    Emotionally abused: Not on file    Physically abused: Not on file    Forced sexual activity: Not on file  Other Topics Concern  . Not on file  Social  History Narrative  . Not on file    Family History  Problem Relation Age of Onset  . Cancer Maternal Aunt        breast  . Diabetes Maternal Aunt   . Heart disease Maternal Aunt   . Diabetes Maternal Uncle   . Heart disease Maternal Uncle   . Cancer Paternal Aunt        breast  . Diabetes Paternal Aunt   . Heart disease Paternal Aunt   . Diabetes Paternal Uncle   . Heart disease Paternal Uncle   . Cancer Paternal Grandmother        breast    BP 106/72   Pulse 62   Ht 5\' 3"  (1.6 m)   Wt 190 lb (86.2 kg)   BMI 33.66 kg/m   Review of Systems: See HPI above.     Objective:  Physical Exam:  Gen: NAD, comfortable in exam room  Left hand/wrist: No deformity, swelling, bruising, warmth or redness. FROM digits with 5/5 strength.  Wrist  motion 20 degrees flexion and extension. Diffuse TTP dorsal wrist. NVI distally.  Assessment & Plan:  1. Left hand injury -2/2 crush injury.  Radiographs, MRI reassuring without bony contusion, fracture, ligament disruption.  Has a ganglion cyst that likely predated the accident and not accounting for her pain.  Use brace and diclofenac as needed, do motion exercises of wrist.  Return to full duty.  F/u prn.

## 2019-03-20 IMAGING — DX DG HAND COMPLETE 3+V*L*
3 series · 3 of 3 positions shown · non-contrast
Comparison: Radiographs November 10, 2018.

CLINICAL DATA: Left hand pain after injury 3 weeks ago.

EXAM:
LEFT HAND - COMPLETE 3+ VIEW

[hand pa]
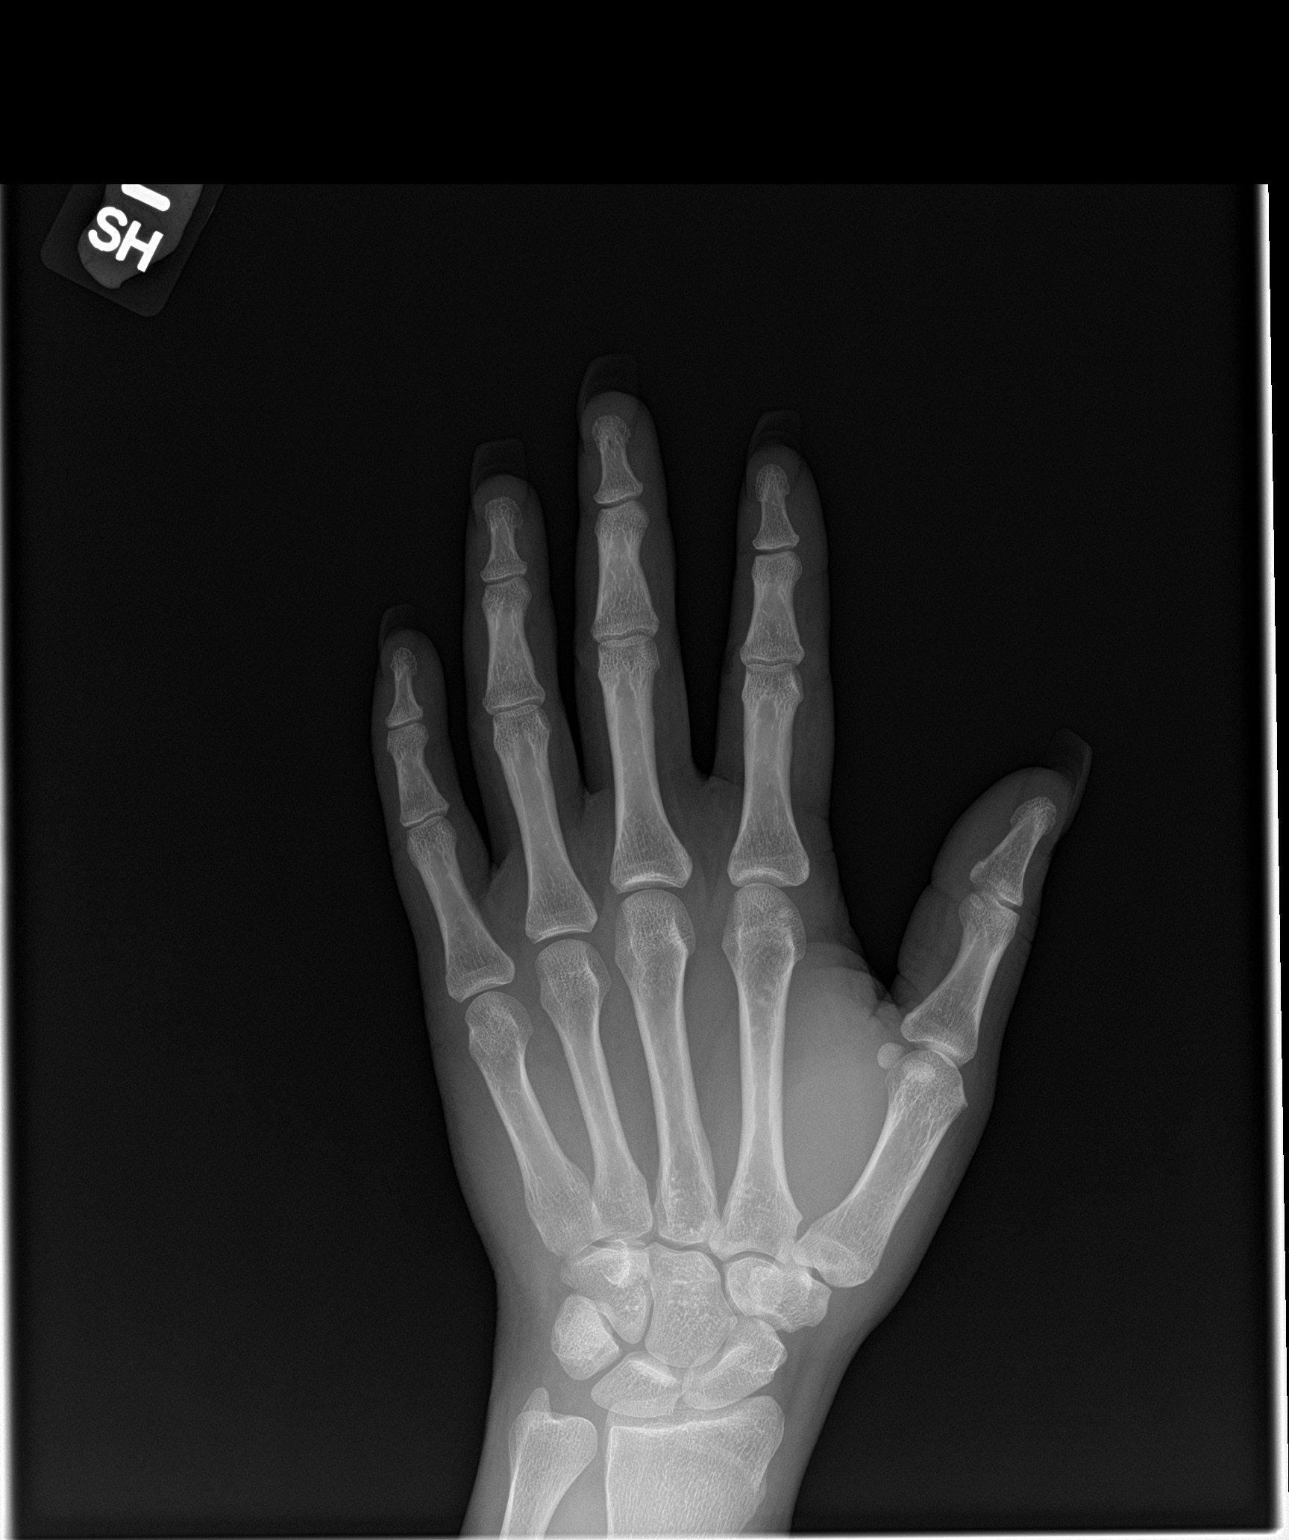

[hand obl]
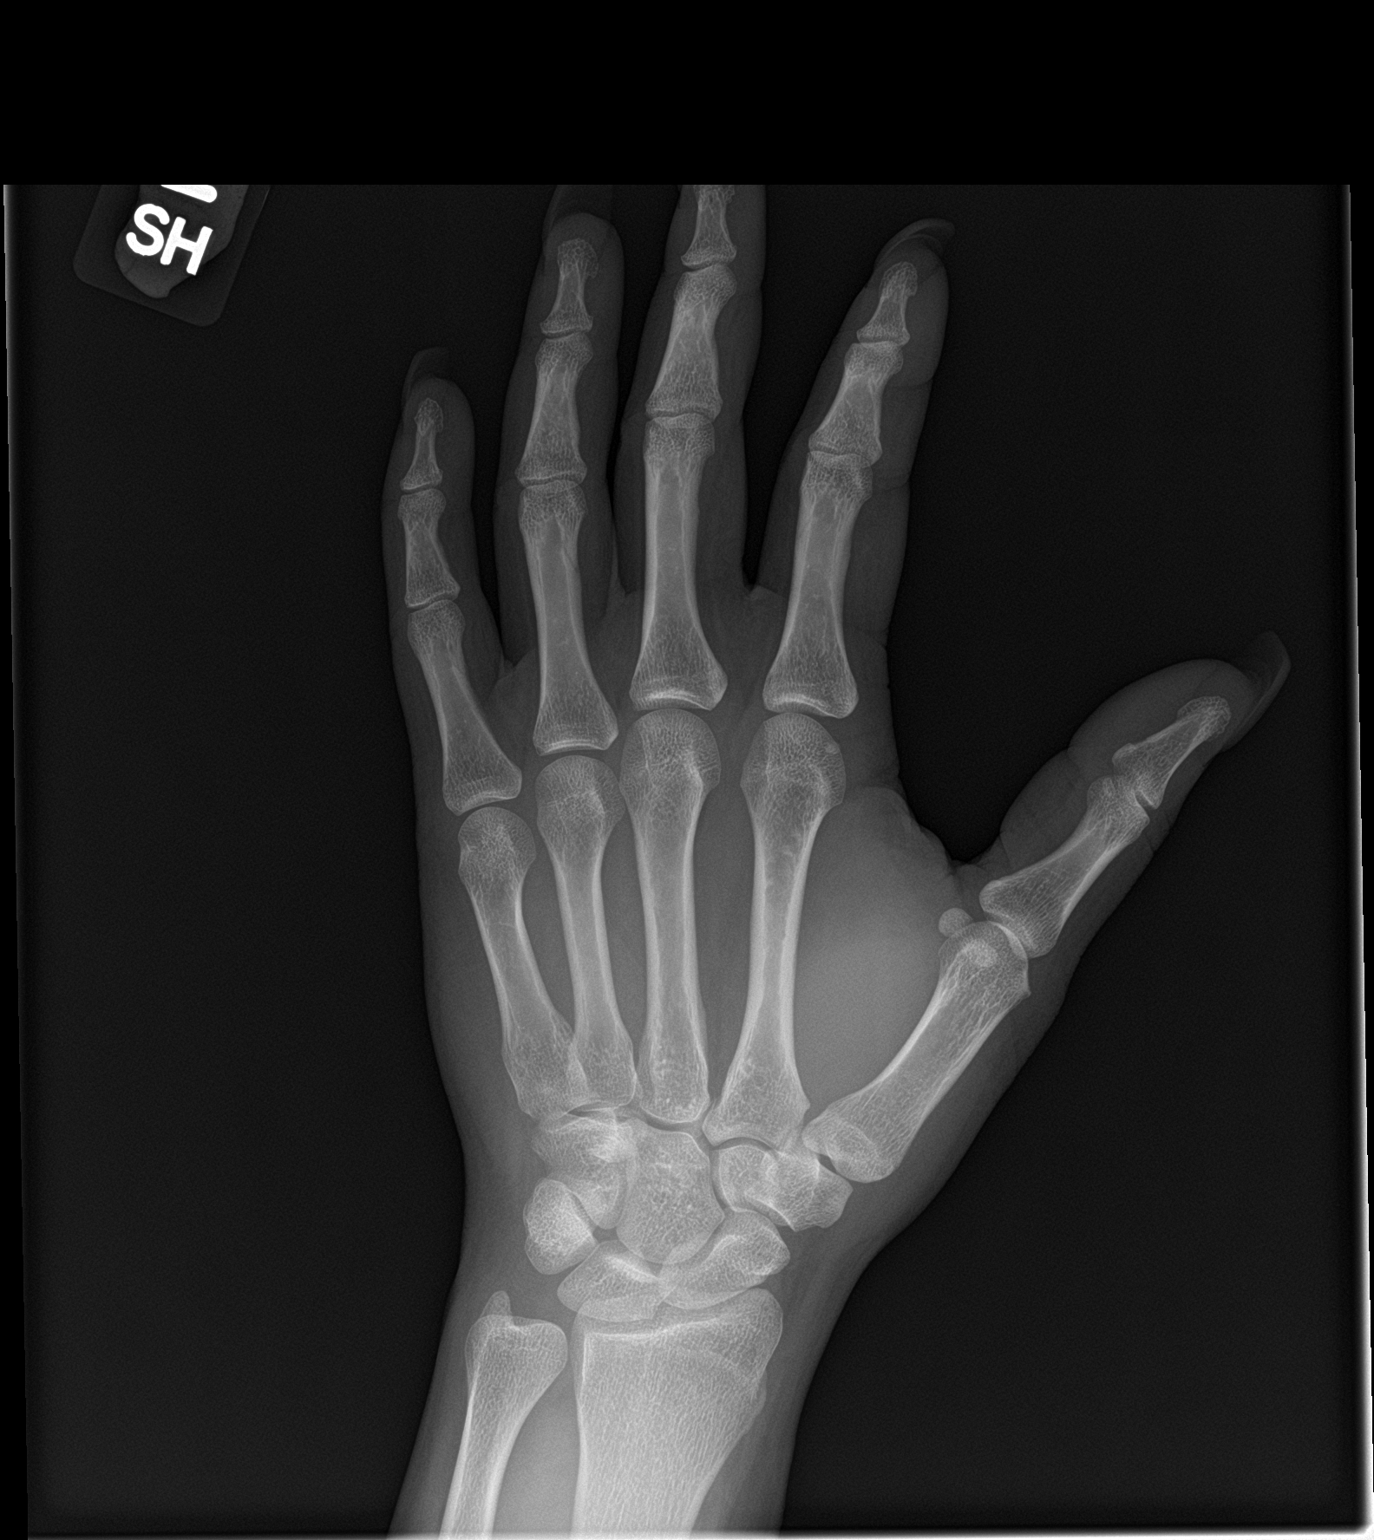

[hand lat]
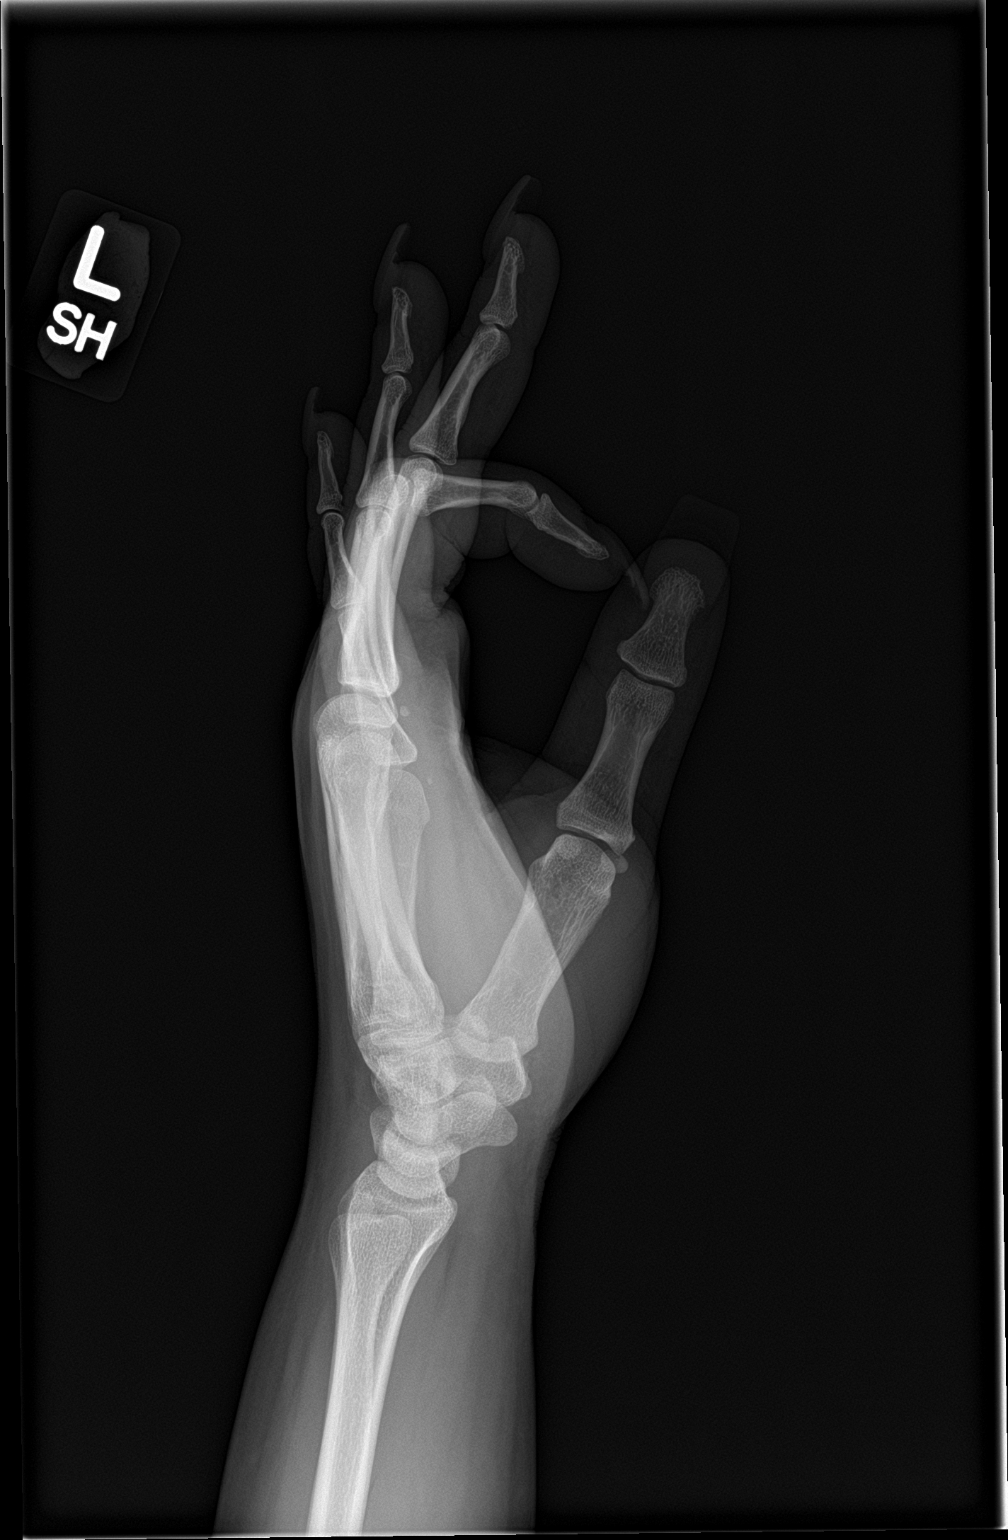

[3 of 3 positions shown; findings below may reference images not displayed]

FINDINGS: There is no evidence of fracture or dislocation. There is no
evidence of arthropathy or other focal bone abnormality. Soft
tissues are unremarkable.
IMPRESSION: Negative.

## 2022-12-09 ENCOUNTER — Emergency Department (HOSPITAL_BASED_OUTPATIENT_CLINIC_OR_DEPARTMENT_OTHER): Payer: Federal, State, Local not specified - PPO

## 2022-12-09 ENCOUNTER — Emergency Department (HOSPITAL_BASED_OUTPATIENT_CLINIC_OR_DEPARTMENT_OTHER)
Admission: EM | Admit: 2022-12-09 | Discharge: 2022-12-09 | Disposition: A | Discharge status: Home / Self Care | Payer: Federal, State, Local not specified - PPO | Attending: Emergency Medicine | Admitting: Emergency Medicine

## 2022-12-09 ENCOUNTER — Other Ambulatory Visit: Payer: Self-pay

## 2022-12-09 ENCOUNTER — Encounter (HOSPITAL_BASED_OUTPATIENT_CLINIC_OR_DEPARTMENT_OTHER): Payer: Self-pay | Admitting: Emergency Medicine

## 2022-12-09 DIAGNOSIS — F32A Depression, unspecified: Secondary | ICD-10-CM | POA: Diagnosis not present

## 2022-12-09 DIAGNOSIS — R079 Chest pain, unspecified: Secondary | ICD-10-CM | POA: Insufficient documentation

## 2022-12-09 LAB — BASIC METABOLIC PANEL
Anion gap: 8 (ref 5–15)
BUN: 10 mg/dL (ref 6–20)
CO2: 26 mmol/L (ref 22–32)
Calcium: 9.4 mg/dL (ref 8.9–10.3)
Chloride: 103 mmol/L (ref 98–111)
Creatinine, Ser: 1 mg/dL (ref 0.44–1.00)
GFR, Estimated: >60 mL/min (ref >60)
Glucose, Bld: 100 mg/dL — ABNORMAL HIGH (ref 70–99)
Potassium: 3.2 mmol/L — ABNORMAL LOW (ref 3.5–5.1)
Sodium: 137 mmol/L (ref 135–145)

## 2022-12-09 LAB — CBC
HCT: 43.8 % (ref 36.0–46.0)
Hemoglobin: 15.2 g/dL — ABNORMAL HIGH (ref 12.0–15.0)
MCH: 30 pg (ref 26.0–34.0)
MCHC: 34.7 g/dL (ref 30.0–36.0)
MCV: 86.4 fL (ref 80.0–100.0)
Platelets: 391 10*3/uL (ref 150–400)
RBC: 5.07 MIL/uL (ref 3.87–5.11)
RDW: 11.9 % (ref 11.5–15.5)
WBC: 7.5 10*3/uL (ref 4.0–10.5)
nRBC: 0 % (ref 0.0–0.2)

## 2022-12-09 LAB — PREGNANCY, URINE: Preg Test, Ur: NEGATIVE

## 2022-12-09 LAB — TROPONIN I (HIGH SENSITIVITY)
Troponin I (High Sensitivity): <2 ng/L (ref <18)
Troponin I (High Sensitivity): <2 ng/L (ref <18)

## 2022-12-09 MED ORDER — ALUM & MAG HYDROXIDE-SIMETH 200-200-20 MG/5ML PO SUSP
30.0000 mL | Freq: Once | ORAL | Status: AC
  Administered 2022-12-09: 30 mL via ORAL
  Filled 2022-12-09: qty 30

## 2022-12-09 NOTE — ED Triage Notes (Signed)
Chest pain, lightheadedness and headache x 1 day. Pt denies, n/v/d denies fever. Pt is mid sternal and does not radiate.

## 2022-12-09 NOTE — ED Notes (Signed)
Written and verbal inst to pt  Verbalized an understanding  To home with family  

## 2022-12-09 NOTE — ED Provider Notes (Signed)
[facility] EMERGENCY DEPARTMENT Provider Note   CSN: [id number] Arrival date & time: 12/09/22  1557     History  Chief Complaint  Patient presents with   Chest Pain   Dizziness    Katelyn Larson is a 36 y.o. female.  Patient is a 36 year old female with no significant past medical history who presents with chest pain.  She says been intermittent for about the last week but was more significant last night and into today.  She describes a pain in the center of her chest.  It is nonradiating.  No associated shortness of breath.  No nausea or vomiting.  Is not worse with eating.  Not worse with deep breathing.  No leg pain or swelling.  She said it is worse with stress.  She has been very depressed since her dad died in 09/16/23.  She has had a hard time dealing with that and she feels like the stress is causing her chest pain.  No prior history of heart disease.  She is a non-smoker.  She denies any thoughts of suicide.       Home Medications Prior to Admission medications   Medication Sig Start Date End Date Taking? Authorizing Provider  diclofenac (VOLTAREN) 75 MG EC tablet TAKE 1 TABLET(75 MG) BY MOUTH TWICE DAILY 01/30/19   Hudnall, Azucena Fallen, MD      Allergies    Latex and Tramadol    Review of Systems   Review of Systems  Constitutional:  Negative for chills, diaphoresis, fatigue and fever.  HENT:  Negative for congestion, rhinorrhea and sneezing.   Eyes: Negative.   Respiratory:  Negative for cough, chest tightness and shortness of breath.   Cardiovascular:  Positive for chest pain. Negative for leg swelling.  Gastrointestinal:  Negative for abdominal pain, blood in stool, diarrhea, nausea and vomiting.  Genitourinary:  Negative for difficulty urinating, flank pain, frequency and hematuria.  Musculoskeletal:  Negative for arthralgias and back pain.  Skin:  Negative for rash.  Neurological:  Negative for dizziness, speech difficulty, weakness, numbness  and headaches.  Psychiatric/Behavioral:  Positive for dysphoric mood. Negative for suicidal ideas.     Physical Exam Updated Vital Signs BP 114/74   Pulse 75   Temp 98.2 F (36.8 C) (Oral)   Resp 16   Ht 5\' 3"  (1.6 m)   Wt 97.1 kg   LMP 03/15/2022 Comment: IUD  SpO2 99%   BMI 37.91 kg/m  Physical Exam Constitutional:      Appearance: She is well-developed.  HENT:     Head: Normocephalic and atraumatic.  Eyes:     Pupils: Pupils are equal, round, and reactive to light.  Cardiovascular:     Rate and Rhythm: Normal rate and regular rhythm.     Heart sounds: Normal heart sounds.  Pulmonary:     Effort: Pulmonary effort is normal. No respiratory distress.     Breath sounds: Normal breath sounds. No wheezing or rales.  Chest:     Chest wall: No tenderness.  Abdominal:     General: Bowel sounds are normal.     Palpations: Abdomen is soft.     Tenderness: There is no abdominal tenderness. There is no guarding or rebound.  Musculoskeletal:        General: Normal range of motion.     Cervical back: Normal range of motion and neck supple.     Comments: No edema or calf tenderness  Lymphadenopathy:     Cervical: No  cervical adenopathy.  Skin:    General: Skin is warm and dry.     Findings: No rash.  Neurological:     Mental Status: She is alert and oriented to person, place, and time.     ED Results / Procedures / Treatments   Labs (all labs ordered are listed, but only abnormal results are displayed) Labs Reviewed  BASIC METABOLIC PANEL - Abnormal; Notable for the following components:      Result Value   Potassium 3.2 (*)    Glucose, Bld 100 (*)    All other components within normal limits  CBC - Abnormal; Notable for the following components:   Hemoglobin 15.2 (*)    All other components within normal limits  PREGNANCY, URINE  TROPONIN I (HIGH SENSITIVITY)  TROPONIN I (HIGH SENSITIVITY)    EKG EKG Interpretation  Date/Time:  Wednesday December 09 2022  16:06:49 EST Ventricular Rate:  96 PR Interval:  130 QRS Duration: 83 QT Interval:  330 QTC Calculation: 417 R Axis:   8 Text Interpretation: Sinus rhythm Consider anterior infarct No old tracing to compare Confirmed by Rolan Bucco [phone]) on 12/09/2022 4:52:19 PM  Radiology DG Chest 2 View  Result Date: 12/09/2022 CLINICAL DATA:  Chest pain EXAM: CHEST - 2 VIEW COMPARISON:  None Available. FINDINGS: The heart size and mediastinal contours are within normal limits. Both lungs are clear. The visualized skeletal structures are unremarkable. IMPRESSION: No active cardiopulmonary disease. Electronically Signed   By: Marlan Palau M.D.   On: 12/09/2022 16:35    Procedures Procedures    Medications Ordered in ED Medications  alum & mag hydroxide-simeth (MAALOX/MYLANTA) 200-200-20 MG/5ML suspension 30 mL (30 mLs Oral Given 12/09/22 1731)    ED Course/ Medical Decision Making/ A&P                           Medical Decision Making Amount and/or Complexity of Data Reviewed Labs: ordered. Radiology: ordered.  Risk OTC drugs.   Patient is a 36 year old female who presents with chest pain.  It is in the center of her chest.  There is no exertional symptoms.  No other associated symptoms.  She does not have symptoms that sound more concerning for aortic injury or PE.  Chest x-ray was performed and shows no evidence of pneumonia or pneumothorax.  This was interpreted by me and confirmed by the radiologist.  No pulmonary edema.  EKG does not show any ischemic changes.  She has had 2 negative troponins.  She is having a lot of stress and feels that this is contributing to the symptoms.  She is currently pain-free.  She is not suicidal.  Encouraged her to have close follow-up with her primary care doctor.  Also was given resources for outpatient follow-up for behavioral health counseling.  Return precautions were given.  Heart score was 1.  Final Clinical Impression(s) / ED Diagnoses Final  diagnoses:  Chest pain, unspecified type  Depression, unspecified depression type    Rx / DC Orders ED Discharge Orders     None         Rolan Bucco, MD 12/09/22 2009

## 2024-05-03 NOTE — Progress Notes (Signed)
 Assessment/Impression   1. Rash  clotrimazole-betamethasone (LOTRISONE) cream   fluconazole (DIFLUCAN) 150 mg tablet   Ambulatory referral to Dermatology      Patient has verbalized an understanding of the limitations for virtual video consults and confirms they are currently in the state of [place] .  Represcribed Diflucan, Lotrisone for rash ref to dermatology due to recurrence, incomplete resolution in past visit.  Patient instructed to be seen in 2 weeks unless rash notably improved then again be seen in 4 weeks if rash is not completely resolved by this time.  The patient has been instructed that if symptoms are not improving, or worsening, that a follow-up visit is strongly encouraged for reevaluation and this should be a face-to-face evaluation with a physical exam. However if symptoms acutely worsen, or if there are any life-threatening concerns, immediate referral to the emergency department has been recommended.  The patient has verbally indicated they are in agreement to this plan.   Plan   1. Orders and medications during the visit: Orders Placed This Encounter  Procedures  . Ambulatory referral to Dermatology    Katelyn Larson had no medications administered during this visit.  2.  There are no Patient Instructions on file for this visit.  3.  Discharge medications and medication changes: Patient's Medications  New Prescriptions   CLOTRIMAZOLE-BETAMETHASONE (LOTRISONE) CREAM    Apply topically 2 (two) times daily for 30 days.   FLUCONAZOLE (DIFLUCAN) 150 MG TABLET    Take 1 tablet po as a single dose, repeat in 3 days  Discontinued Medications   No medications on file    Subjective   Chief Complaint  Patient presents with  . Rash    I have a rash that is flaring up, I have been seen before and the cream that they gave me is not working.    HPI:  Katelyn Larson is a 38 y.o. (DOB [birth year]) female.  This patient was seen via video virtual visit.   History is obtained from a patient/provider video consult. The patient understands the limitations of a video visit versus a face to face encounter and has elected to proceed with virtual care visit. Further, the patient understands that in some instances the virtual visit might require further evaluation/workup with face to face care in a medical facility.  The patient has confirmed they are physically present in the state of [place]  for the duration of this encounter.  The patient has concern/complaint of a rash to her legs that is itchy. She was seen by another clinic and prescribed diflucan Lotrimin cream in January which helped the rash but did not resolve it.  It started to flair up again 2 weeks ago.  This rash is near her ankle.     Past Medical History, Past Surgery History, Allergies, Social History, and Family History, meds, and Allergies were reviewed and updated.    Objective   Vital signs: Ht 5' 3 (1.6 m)   Wt 190 lb (86.2 kg)   Breastfeeding No   BMI 33.66 kg/m   General: Alert, no acute distress noted during video consult  HEENT:  Eyes grossly normal. Nasal passages appear grossly normal. Resp: No visible signs of respiratory distress. No labored breathing noted. Normal appearing respiratory rate.  Musculoskeletal: Range of motion appears grossly normal (noted on limited extremity visualization through virtual video)  Neuro: Alert, grossly oriented. No obvious abnormalities noted.  Skin: darkened and raised patch of skin with scaling on inner ankle aprox 4cm in  diameter.    Results of tests available at time of visit: No results found for this or any previous visit (from the past 24 hours). No results found for this or any previous visit.

## 2024-05-25 NOTE — Progress Notes (Signed)
 Subjective:    Patient ID:  Katelyn Larson is a 38 y.o. female  Chief Complaint: Chief Complaint  Patient presents with  . Headache    The patient presents with headache, ongoing for 4 days. Admits to light and noise sensitivity.    HPI  History is provided by the patient.  Patient presents with gradual onset of severe headache that feels like a migraine headache that she has had previously.  Symptoms started 05/22/2024 associated with photophobia and phonophobia.  Headache is frontal bilaterally.  Mild associated nausea.  She has tried Excedrin this morning at 6 AM without relief.  Denies any known triggers.  Denies possibility of pregnancy and has a Mirena IUD.  She denies nuchal rigidity, fever, blurred vision, recent injury, vomiting, recent illness, or cold symptoms.   Past medical, surgical and family history reviewed.    Social History   Tobacco Use: Low Risk  (02/13/2023)   Received from [facility]   Patient History   . Smoking Tobacco Use: Never   . Smokeless Tobacco Use: Never   . Passive Exposure: Not on file      Review of Systems  Constitutional:  Negative for fever.  HENT:  Negative for congestion and sore throat.   Eyes:  Negative for blurred vision.  Respiratory:  Negative for cough and shortness of breath.   Cardiovascular:  Negative for chest pain.  Gastrointestinal:  Positive for nausea. Negative for vomiting.  Skin:  Negative for rash.  Neurological:  Positive for headaches. Negative for dizziness, sensory change and weakness.     Objective:   Vitals:   05/25/24 0819  BP: 127/68  BP Location: Left Upper Arm  Patient Position: Sitting  Pulse: 60  Resp: 16  Temp: 98.1 F (36.7 C)  TempSrc: Oral  SpO2: 95%  Weight: 206 lb 12.8 oz (93.8 kg)  Height: 5' 3 (1.6 m)     Physical Exam Vitals and nursing note reviewed.  Constitutional:      General: She is not in acute distress.    Appearance: Normal appearance. She is not  ill-appearing.  HENT:     Head: Normocephalic.     Right Ear: Tympanic membrane normal.     Left Ear: Tympanic membrane normal.     Nose: Nose normal. No congestion.     Mouth/Throat:     Pharynx: No oropharyngeal exudate or posterior oropharyngeal erythema.  Eyes:     General:        Right eye: No discharge.        Left eye: No discharge.     Conjunctiva/sclera: Conjunctivae normal.  Cardiovascular:     Rate and Rhythm: Normal rate and regular rhythm.     Heart sounds: Normal heart sounds.  Pulmonary:     Effort: Pulmonary effort is normal. No respiratory distress.     Breath sounds: Normal breath sounds.  Skin:    General: Skin is warm.     Capillary Refill: Capillary refill takes less than 2 seconds.  Neurological:     General: No focal deficit present.     Mental Status: She is alert.     Cranial Nerves: No cranial nerve deficit.     Sensory: No sensory deficit.     Comments: CN II through XII intact.  Grip strength equal bilaterally.  EOMI.       Assessment:    Results of tests available at time of visit: No results found for this or any previous visit (from the  past 24 hours).    Imaging: No results found.   Plan:   1. Migraine without aura and with status migrainosus, not intractable     No orders of the defined types were placed in this encounter.   [name] was seen today for headache.  Diagnoses and all orders for this visit:  Migraine without aura and with status migrainosus, not intractable -     SUMAtriptan  succinate (IMITREX ) 50 mg tablet; Take one tablet (50 mg dose) by mouth daily as needed for Migraine for up to 9 doses.      MDM:  Katelyn Larson is a 38 y.o. female with migraine headache.  No known contraindications to sumatriptan .  Toradol contraindicated in clinic today because she took 500 mg aspirin 2 hours prior to arrival.  No meningismus.  No focal neurological deficits.  Sumatriptan  prescribed once daily as needed for  migraine headache.  Follow-up with primary care in 1-2 weeks if symptoms persist.  ER if worsening symptoms, fever, visual disturbance or new weakness.    The patient indicates understanding of these issues and agrees with the plan. Bernardino ONEIDA Schiller, PA-C    Text above is via Dragon naturally speaking voice recognition transcription. - there may be typographical errors.  Patient Instructions  You can take ibuprofen  800 mg starting at 2 pm today.  Hold off on taking Excedrin now.  I prescribed a migraine medicine to take once daily as needed. This works best when you take it on days you take ibuprofen  or aspirin (which is inside Excedrin).  Go to the ER if you have new visual disturbance, fever, or new weakness.

## 2024-07-23 ENCOUNTER — Encounter (HOSPITAL_BASED_OUTPATIENT_CLINIC_OR_DEPARTMENT_OTHER): Payer: Self-pay

## 2024-07-23 ENCOUNTER — Other Ambulatory Visit: Payer: Self-pay

## 2024-07-23 ENCOUNTER — Emergency Department (HOSPITAL_BASED_OUTPATIENT_CLINIC_OR_DEPARTMENT_OTHER)

## 2024-07-23 ENCOUNTER — Emergency Department (HOSPITAL_BASED_OUTPATIENT_CLINIC_OR_DEPARTMENT_OTHER)
Admission: EM | Admit: 2024-07-23 | Discharge: 2024-07-23 | Disposition: A | Discharge status: Home / Self Care | Attending: Emergency Medicine | Admitting: Emergency Medicine

## 2024-07-23 DIAGNOSIS — Z91040 Latex allergy status: Secondary | ICD-10-CM | POA: Insufficient documentation

## 2024-07-23 DIAGNOSIS — J069 Acute upper respiratory infection, unspecified: Secondary | ICD-10-CM | POA: Insufficient documentation

## 2024-07-23 DIAGNOSIS — R0789 Other chest pain: Secondary | ICD-10-CM | POA: Diagnosis not present

## 2024-07-23 DIAGNOSIS — G43809 Other migraine, not intractable, without status migrainosus: Secondary | ICD-10-CM | POA: Diagnosis not present

## 2024-07-23 DIAGNOSIS — R519 Headache, unspecified: Secondary | ICD-10-CM | POA: Diagnosis present

## 2024-07-23 LAB — PREGNANCY, URINE: Preg Test, Ur: NEGATIVE

## 2024-07-23 LAB — RESP PANEL BY RT-PCR (RSV, FLU A&B, COVID)  RVPGX2
Influenza A by PCR: NEGATIVE
Influenza B by PCR: NEGATIVE
Resp Syncytial Virus by PCR: NEGATIVE
SARS Coronavirus 2 by RT PCR: NEGATIVE

## 2024-07-23 MED ORDER — SUMATRIPTAN SUCCINATE 6 MG/0.5ML ~~LOC~~ SOLN
6.0000 mg | Freq: Once | SUBCUTANEOUS | Status: AC
  Administered 2024-07-23: 6 mg via SUBCUTANEOUS
  Filled 2024-07-23: qty 0.5

## 2024-07-23 MED ORDER — SUMATRIPTAN SUCCINATE 100 MG PO TABS: 100.0000 mg | ORAL_TABLET | ORAL | 0 refills | Status: AC | PRN

## 2024-07-23 NOTE — ED Triage Notes (Signed)
 PT c/o intermittent dull chest pain that started yesterday. States they take ibuprofen , but does not relieve pain. Pt also c/o headaches on and off for the past month. Notes they were sick two weeks ago and the cough makes it worse. Pt does not appear to be in distress at this time.

## 2024-07-23 NOTE — ED Provider Notes (Signed)
 [place] [facility] Provider Note   CSN: [id number] Arrival date & time: 07/23/24  1701     Patient presents with: Chest Pain   Katelyn Larson is a 38 y.o. female.   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  Pain was intermittent last night throughout her shift and persists throughout the day today.  She states that she has had a nonproductive cough for the past 2 weeks and states there is a cold going around work and she suspects this is what she has had.  She denies fevers or chills.  She also states that she has a headache, history of migraine, similar to prior without new or different symptoms.  She is requesting dose of Imitrex  which was provided to her at urgent care in June with relief. Denies history of hypertension, hyperlipidemia, diabetes.  Is a non-smoker.       Prior to Admission medications   Medication Sig Start Date End Date Taking? Authorizing Provider  SUMAtriptan  (IMITREX ) 100 MG tablet Take 1 tablet (100 mg total) by mouth every 2 (two) hours as needed for migraine. May repeat in 2 hours if headache persists or recurs. 07/23/24  Yes Beverley Leita LABOR, PA-C  diclofenac  (VOLTAREN ) 75 MG EC tablet TAKE 1 TABLET(75 MG) BY MOUTH TWICE DAILY 01/30/19   Hudnall, Ludie SAUNDERS, MD    Allergies: Latex, Penicillins, and Tramadol    Review of Systems Negative except as per HPI Updated Vital Signs BP 136/78 (BP Location: Right Arm)   Pulse 67   Temp 98.7 F (37.1 C) (Oral)   Resp (!) 23   Ht 5' 3 (1.6 m)   Wt 90.7 kg   SpO2 99%   BMI 35.43 kg/m   Physical Exam Vitals and nursing note reviewed.  Constitutional:      General: She is not in acute distress.    Appearance: She is well-developed. She is not diaphoretic.  HENT:     Head: Normocephalic and atraumatic.  Cardiovascular:     Rate and Rhythm: Normal  rate and regular rhythm.     Heart sounds: Normal heart sounds. No murmur heard. Pulmonary:     Effort: Pulmonary effort is normal.     Breath sounds: Normal breath sounds.  Chest:     Chest wall: Tenderness present.    Abdominal:     Palpations: Abdomen is soft.     Tenderness: There is no abdominal tenderness.  Musculoskeletal:     Cervical back: Neck supple.     Right lower leg: No tenderness. No edema.     Left lower leg: No tenderness. No edema.  Skin:    General: Skin is warm and dry.     Findings: No erythema or rash.  Neurological:     General: No focal deficit present.     Mental Status: She is alert and oriented to person, place, and time.     GCS: GCS eye subscore is 4. GCS verbal subscore is 5. GCS motor subscore is 6.     Cranial Nerves: Cranial nerves 2-12 are intact. No cranial nerve deficit.     Motor: No weakness.     Gait: Gait is intact. Gait normal.  Psychiatric:        Behavior: Behavior normal.     (all labs ordered are listed, but only abnormal results are  displayed) Labs Reviewed  RESP PANEL BY RT-PCR (RSV, FLU A&B, COVID)  RVPGX2  PREGNANCY, URINE    EKG: EKG Interpretation Date/Time:  Sunday July 23 2024 17:10:22 EDT Ventricular Rate:  67 PR Interval:  136 QRS Duration:  75 QT Interval:  387 QTC Calculation: 409 R Axis:   7  Text Interpretation: Sinus rhythm Nonspecific T abnormalities, anterior leads No significant changes from prior Confirmed by Stevens, Joseph ([id number]) on 07/23/2024 6:14:04 PM  Radiology: DG Chest 2 View Result Date: 07/23/2024 CLINICAL DATA:  Cough and chest pain. EXAM: CHEST - 2 VIEW COMPARISON:  12/09/2022. FINDINGS: The heart size and mediastinal contours are within normal limits. Both lungs are clear. No acute osseous abnormality. IMPRESSION: No active cardiopulmonary disease. Electronically Signed   By: Isa Hitz  Taylor M.D.   On: 07/23/2024 17:48     Procedures   Medications Ordered in the ED  SUMAtriptan  (IMITREX) injection 6 mg (6 mg Subcutaneous Given 07/23/24 1744)                                    Medical Decision Making Amount and/or Complexity of Data Reviewed Labs: ordered. Radiology: ordered.   This patient presents to the ED for concern of headache, chest wall pain, cough, this involves an extensive number of treatment options, and is a complaint that carries with it a high risk of complications and morbidity.  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   Lab Tests:  I Ordered, and personally interpreted labs.  The pertinent results include: hCG negative, viral swab negative for COVID, flu, RSV   Imaging Studies ordered:  I ordered imaging studies including chest x-ray I independently visualized and interpreted imaging which showed no acute process I agree with the radiologist interpretation   Cardiac Monitoring: / EKG:  The patient was maintained on a cardiac monitor.  I personally viewed and interpreted the cardiac monitored which showed an underlying rhythm of: sinus rhythm, rate 67   Problem List / ED Course / Critical interventions / Medication management  38 year old female presents with complaint of intermittent chest pain, cough, headache.  Her chest pain is reproduced with palpation.  Suspect this may be secondary to her viral cough.  Plan is to provide Imitrex as requested for her headache, obtain chest x-ray, viral panel negative.  I ordered medication including Imitrex   Reevaluation of the patient after these medicines showed that the patient remained stable.  I have reviewed the  patients home medicines and have made adjustments as needed   Social Determinants of Health:  No PCP on file   Test / Admission - Considered:  Stable for discharge      Final diagnoses:  Viral URI with cough  Chest wall pain  Other migraine without status migrainosus, not intractable    ED Discharge Orders          Ordered    SUMAtriptan (IMITREX) 100 MG tablet  Every 2 hours PRN        08 /10/25 1816               Beverley Doffing  A, PA-C 07/23/24 1817    Mannie Pac T, DO 07/26/24 [phone]

## 2024-07-23 NOTE — Discharge Instructions (Signed)
 Recheck with your primary care provider.  Return to the ER for worsening or concerning symptoms.  Prescription for Imitrex  to use as directed.

## 2024-07-23 NOTE — ED Notes (Signed)
 Patient transported to X-ray

## 2024-07-30 ENCOUNTER — Emergency Department (HOSPITAL_BASED_OUTPATIENT_CLINIC_OR_DEPARTMENT_OTHER)
Admission: EM | Admit: 2024-07-30 | Discharge: 2024-07-30 | Disposition: A | Discharge status: Home / Self Care | Attending: Emergency Medicine | Admitting: Emergency Medicine

## 2024-07-30 ENCOUNTER — Encounter (HOSPITAL_BASED_OUTPATIENT_CLINIC_OR_DEPARTMENT_OTHER): Payer: Self-pay | Admitting: Emergency Medicine

## 2024-07-30 ENCOUNTER — Emergency Department (HOSPITAL_BASED_OUTPATIENT_CLINIC_OR_DEPARTMENT_OTHER)

## 2024-07-30 DIAGNOSIS — R0789 Other chest pain: Secondary | ICD-10-CM | POA: Insufficient documentation

## 2024-07-30 DIAGNOSIS — Z91040 Latex allergy status: Secondary | ICD-10-CM | POA: Insufficient documentation

## 2024-07-30 LAB — CBC
HCT: 37.4 % (ref 36.0–46.0)
Hemoglobin: 12.9 g/dL (ref 12.0–15.0)
MCH: 29.3 pg (ref 26.0–34.0)
MCHC: 34.5 g/dL (ref 30.0–36.0)
MCV: 85 fL (ref 80.0–100.0)
Platelets: 441 K/uL — ABNORMAL HIGH (ref 150–400)
RBC: 4.4 MIL/uL (ref 3.87–5.11)
RDW: 12.2 % (ref 11.5–15.5)
WBC: 7.9 K/uL (ref 4.0–10.5)
nRBC: 0 % (ref 0.0–0.2)

## 2024-07-30 LAB — BASIC METABOLIC PANEL WITH GFR
Anion gap: 11 (ref 5–15)
BUN: 10 mg/dL (ref 6–20)
CO2: 25 mmol/L (ref 22–32)
Calcium: 9.4 mg/dL (ref 8.9–10.3)
Chloride: 104 mmol/L (ref 98–111)
Creatinine, Ser: 0.83 mg/dL (ref 0.44–1.00)
GFR, Estimated: >60 mL/min (ref >60)
Glucose, Bld: 81 mg/dL (ref 70–99)
Potassium: 3.7 mmol/L (ref 3.5–5.1)
Sodium: 140 mmol/L (ref 135–145)

## 2024-07-30 LAB — RESP PANEL BY RT-PCR (RSV, FLU A&B, COVID)  RVPGX2
Influenza A by PCR: NEGATIVE
Influenza B by PCR: NEGATIVE
Resp Syncytial Virus by PCR: NEGATIVE
SARS Coronavirus 2 by RT PCR: NEGATIVE

## 2024-07-30 LAB — TROPONIN T, HIGH SENSITIVITY: Troponin T High Sensitivity: <15 ng/L (ref 0–19)

## 2024-07-30 LAB — PREGNANCY, URINE: Preg Test, Ur: NEGATIVE

## 2024-07-30 MED ORDER — KETOROLAC TROMETHAMINE 15 MG/ML IJ SOLN
15.0000 mg | Freq: Once | INTRAMUSCULAR | Status: AC
  Administered 2024-07-30: 15 mg via INTRAVENOUS
  Filled 2024-07-30: qty 1

## 2024-07-30 NOTE — ED Triage Notes (Signed)
 Pt coming in from home with chest pain and light headedness that started before last Sunday. Pt states she has been taking tylenol  and ibuprofen  without relief.

## 2024-07-30 NOTE — ED Provider Notes (Signed)
 [place] [facility] Provider Note   CSN: [id number] Arrival date & time: 07/30/24  0421     Patient presents with: Chest Pain   Katelyn Larson is a 38 y.o. female.   The history is provided by the patient.  Chest Pain Katelyn Larson is a 38 y.o. female who presents to the Emergency Department complaining of chest pain. She presents to the emergency department for 1 1/2 weeks of strong waxing and waning central chest pain. Pain is worse when she is upset. Sometimes the pain goes away when she goes to bed. Otherwise no clear alleviating or worsening factors. No fever, cough, sore throat, nausea, vomiting, leg swelling or pain. Occasionally she is lightheaded. No known medical problems and no medications. Denies any chance of pregnancy. No history of DVT or PE. No prior similar symptoms. She is under increased stress lately.  No tobacco use.     Prior to Admission medications   Medication Sig Start Date End Date Taking? Authorizing Provider  diclofenac  (VOLTAREN ) 75 MG EC tablet TAKE 1 TABLET(75 MG) BY MOUTH TWICE DAILY 01/30/19   Hudnall, Ludie SAUNDERS, MD  SUMAtriptan  (IMITREX ) 100 MG tablet Take 1 tablet (100 mg total) by mouth every 2 (two) hours as needed for migraine. May repeat in 2 hours if headache persists or recurs. 07/23/24   Beverley Leita LABOR, PA-C    Allergies: Latex, Penicillins, and Tramadol    Review of Systems  Cardiovascular:  Positive for chest pain.  All other systems reviewed and are negative.   Updated Vital Signs BP 130/74   Pulse 68   Temp 98.3 F (36.8 C) (Oral)   Resp 19   Ht 5' 3 (1.6 m)   Wt 90.7 kg   SpO2 100%   BMI 35.43 kg/m   Physical Exam Vitals and nursing note reviewed.  Constitutional:      Appearance: She is well-developed.  HENT:     Head: Normocephalic and atraumatic.  Cardiovascular:     Rate and Rhythm: Normal rate and regular rhythm.     Heart sounds: No murmur heard. Pulmonary:      Effort: Pulmonary effort is normal. No respiratory distress.     Breath sounds: Normal breath sounds.  Abdominal:     Palpations: Abdomen is soft.     Tenderness: There is no abdominal tenderness. There is no guarding or rebound.  Musculoskeletal:        General: No swelling or tenderness.  Skin:    General: Skin is warm and dry.  Neurological:     Mental Status: She is alert and oriented to person, place, and time.  Psychiatric:        Behavior: Behavior normal.     (all labs ordered are listed, but only abnormal results are displayed) Labs Reviewed  CBC - Abnormal; Notable for the following components:      Result Value   Platelets 441 (*)    All other components within normal limits  RESP PANEL BY RT-PCR (RSV, FLU A&B, COVID)  RVPGX2  PREGNANCY, URINE  BASIC METABOLIC PANEL WITH GFR  TROPONIN T, HIGH SENSITIVITY    EKG: EKG Interpretation Date/Time:  Sunday July 30 2024 04:43:35 EDT Ventricular Rate:  67 PR Interval:  141 QRS Duration:  75 QT Interval:  406 QTC Calculation: 429 R Axis:   21  Text Interpretation: Sinus rhythm Low voltage, precordial leads Borderline T abnormalities, anterior leads Confirmed by Griselda Norris [phone]) on 07/30/2024  4:48:33 AM  Radiology: DG Chest 2 View Result Date: 07/30/2024 EXAM: 2 VIEW(S) XRAY OF THE CHEST 07/30/2024 05:10:00 AM COMPARISON: 07/23/2024 CLINICAL HISTORY: Chest pain. Pt coming in from home with chest pain and light headedness that started before last Sunday. Pt states she has been taking tylenol  and ibuprofen  without relief. FINDINGS: LUNGS AND PLEURA: No focal pulmonary opacity. No pulmonary edema. No pleural effusion. No pneumothorax. HEART AND MEDIASTINUM: No acute abnormality of the cardiac and mediastinal silhouettes. BONES AND SOFT TISSUES: No acute osseous abnormality. IMPRESSION: 1. No acute process. Electronically signed by: Waddell Calk MD 07/30/2024 05:27 AM EDT RP Workstation: [id number]     Procedures    Medications Ordered in the ED  ketorolac  (TORADOL ) 15 MG/ML injection 15 mg (15 mg Intravenous Given 07/30/24 0533)                                    Medical Decision Making Amount and/or Complexity of Data Reviewed Labs: ordered. Radiology: ordered. ECG/medicine tests: ordered.  Risk Prescription drug management.   Patient without significant medical history here for evaluation of a week and a half of central chest pain. EKG is non-ischemic and troponin is negative. Doubt PE she is Perc negative. Current clinical picture is not consistent with ACS, PE, dissection. No evidence of pneumonia. Symptoms resolved after ketorolac  administration. Discussed with patient home care for atypical chest pain. This may be musculoskeletal in nature. Also discussed dealing with increased dressers at home. Feels she is stable for discharge home with outpatient follow-up and return precautions.     Final diagnoses:  Atypical chest pain    ED Discharge Orders     None          Griselda Norris, MD 07/30/24 [phone]

## 2024-10-08 ENCOUNTER — Encounter (HOSPITAL_BASED_OUTPATIENT_CLINIC_OR_DEPARTMENT_OTHER): Payer: Self-pay | Admitting: Emergency Medicine

## 2024-10-08 ENCOUNTER — Emergency Department (HOSPITAL_BASED_OUTPATIENT_CLINIC_OR_DEPARTMENT_OTHER): Admission: EM | Admit: 2024-10-08 | Discharge: 2024-10-08 | Disposition: A | Discharge status: Home / Self Care

## 2024-10-08 ENCOUNTER — Other Ambulatory Visit: Payer: Self-pay

## 2024-10-08 DIAGNOSIS — Z91040 Latex allergy status: Secondary | ICD-10-CM | POA: Diagnosis not present

## 2024-10-08 DIAGNOSIS — Z23 Encounter for immunization: Secondary | ICD-10-CM | POA: Diagnosis not present

## 2024-10-08 DIAGNOSIS — W25XXXA Contact with sharp glass, initial encounter: Secondary | ICD-10-CM | POA: Diagnosis not present

## 2024-10-08 DIAGNOSIS — S81011A Laceration without foreign body, right knee, initial encounter: Secondary | ICD-10-CM | POA: Diagnosis present

## 2024-10-08 DIAGNOSIS — S81819A Laceration without foreign body, unspecified lower leg, initial encounter: Secondary | ICD-10-CM

## 2024-10-08 MED ORDER — TETANUS-DIPHTH-ACELL PERTUSSIS 5-2-15.5 LF-MCG/0.5 IM SUSP
0.5000 mL | Freq: Once | INTRAMUSCULAR | Status: AC
  Administered 2024-10-08: 0.5 mL via INTRAMUSCULAR
  Filled 2024-10-08: qty 0.5

## 2024-10-08 MED ORDER — LIDOCAINE-EPINEPHRINE (PF) 1 %-1:200000 IJ SOLN
10.0000 mL | Freq: Once | INTRAMUSCULAR | Status: AC
  Administered 2024-10-08: 10 mL
  Filled 2024-10-08: qty 30

## 2024-10-08 NOTE — Discharge Instructions (Signed)
 Thank you for letting us  evaluate you today.  Sutures come out in 7-10 days.  Return to Emergency Department, urgent care for removal.  Do not submerge in dirty water, bath water, 12, pool, hot tub.  Try to leave open to air 8.  You may put bacitracin or Neosporin on wound if you are going to cover with a Band-Aid

## 2024-10-08 NOTE — ED Notes (Signed)
 Pt transferred from WR to FT 2. Assuming pt care at this time.

## 2024-10-08 NOTE — ED Triage Notes (Signed)
 Pt with laceration to RT knee from a mirror just PTA

## 2024-10-08 NOTE — ED Provider Notes (Signed)
 [place] [facility] Provider Note   CSN: [id number] Arrival date & time: 10/08/24  1756     Patient presents with: Laceration   Katelyn Larson is a 38 y.o. female.  {Add pertinent medical, surgical, social history, OB history to HPI:32947}  Laceration      Prior to Admission medications   Medication Sig Start Date End Date Taking? Authorizing Provider  diclofenac  (VOLTAREN ) 75 MG EC tablet TAKE 1 TABLET(75 MG) BY MOUTH TWICE DAILY 01/30/19   Hudnall, Ludie SAUNDERS, MD  SUMAtriptan  (IMITREX ) 100 MG tablet Take 1 tablet (100 mg total) by mouth every 2 (two) hours as needed for migraine. May repeat in 2 hours if headache persists or recurs. 07/23/24   Beverley Leita LABOR, PA-C    Allergies: Amoxicillin, Latex, Penicillins, and Tramadol    Review of Systems  Updated Vital Signs BP (!) 132/90 (BP Location: Right Arm)   Pulse 74   Temp 98.7 F (37.1 C) (Oral)   Resp 20   Ht 5' 3 (1.6 m)   Wt 94.3 kg   SpO2 100%   BMI 36.85 kg/m   Physical Exam  (all labs ordered are listed, but only abnormal results are displayed) Labs Reviewed - No data to display  EKG: None  Radiology: No results found.  {Document cardiac monitor, telemetry assessment procedure when appropriate:32947} .Laceration Repair  Date/Time: 10/08/2024 7:45 PM  Performed by: Minnie Tinnie BRAVO, PA Authorized by: Minnie Tinnie BRAVO, PA   Consent:    Consent obtained:  Verbal   Consent given by:  Patient   Risks, benefits, and alternatives were discussed: yes     Risks discussed:  Infection, pain, poor cosmetic result and poor wound healing   Alternatives discussed:  No treatment Universal protocol:    Patient identity confirmed:  Verbally with patient and arm band Anesthesia:    Anesthesia method:  Local infiltration   Local anesthetic:  Lidocaine  1% WITH epi Laceration details:    Length (cm):  2 Treatment:    Irrigation solution:  Sterile saline   Irrigation  volume:  500 Skin repair:    Repair method:  Sutures   Suture size:  5-0   Suture material:  Prolene   Suture technique:  Simple interrupted   Number of sutures:  5 Approximation:    Approximation:  Close Repair type:    Repair type:  Simple Post-procedure details:    Dressing:  Antibiotic ointment   Procedure completion:  Tolerated well, no immediate complications    Medications Ordered in the ED  Tdap (ADACEL) injection 0.5 mL (has no administration in time range)  lidocaine -EPINEPHrine (PF) (XYLOCAINE -EPINEPHrine) 1 %-1:200000 (PF) injection 10 mL (has no administration in time range)      {Click here for ABCD2, HEART and other calculators REFRESH Note before signing:1}                              Medical Decision Making Risk Prescription drug management.   ***  {Document critical care time when appropriate  Document review of labs and clinical decision tools ie CHADS2VASC2, etc  Document your independent review of radiology images and any outside records  Document your discussion with family members, caretakers and with consultants  Document social determinants of health affecting pt's care  Document your decision making why or why not admission, treatments were needed:32947:::1}   Final diagnoses:  None    ED Discharge  Orders     None
